# Patient Record
Sex: Female | Born: 1975
Health system: Southern US, Community
[De-identification: ages and names within clinical notes are randomized; demographics above are authoritative.]

---

## 2017-06-15 DIAGNOSIS — K259 Gastric ulcer, unspecified as acute or chronic, without hemorrhage or perforation: Secondary | ICD-10-CM

## 2017-06-15 HISTORY — DX: Gastric ulcer, unspecified as acute or chronic, without hemorrhage or perforation: K25.9

## 2017-07-26 ENCOUNTER — Encounter: Payer: Self-pay | Admitting: Physician Assistant

## 2017-07-26 ENCOUNTER — Other Ambulatory Visit: Payer: Self-pay

## 2017-07-26 ENCOUNTER — Ambulatory Visit (INDEPENDENT_AMBULATORY_CARE_PROVIDER_SITE_OTHER): Payer: BLUE CROSS/BLUE SHIELD

## 2017-07-26 ENCOUNTER — Ambulatory Visit: Payer: BLUE CROSS/BLUE SHIELD | Admitting: Physician Assistant

## 2017-07-26 VITALS — BP 142/78 | HR 100 | Temp 98.0°F | Resp 16 | Ht 64.0 in | Wt 135.0 lb

## 2017-07-26 DIAGNOSIS — K0510 Chronic gingivitis, plaque induced: Secondary | ICD-10-CM

## 2017-07-26 DIAGNOSIS — R042 Hemoptysis: Secondary | ICD-10-CM | POA: Diagnosis not present

## 2017-07-26 DIAGNOSIS — R0789 Other chest pain: Secondary | ICD-10-CM

## 2017-07-26 DIAGNOSIS — R079 Chest pain, unspecified: Secondary | ICD-10-CM | POA: Diagnosis not present

## 2017-07-26 LAB — CBC WITH DIFFERENTIAL/PLATELET
BASOS: 1 %
Basophils Absolute: 0 10*3/uL (ref 0.0–0.2)
EOS (ABSOLUTE): 0.1 10*3/uL (ref 0.0–0.4)
EOS: 2 %
HEMATOCRIT: 34.4 % (ref 34.0–46.6)
HEMOGLOBIN: 11.2 g/dL (ref 11.1–15.9)
Immature Grans (Abs): 0 10*3/uL (ref 0.0–0.1)
Immature Granulocytes: 0 %
Lymphocytes Absolute: 1.5 10*3/uL (ref 0.7–3.1)
Lymphs: 37 %
MCH: 30.8 pg (ref 26.6–33.0)
MCHC: 32.6 g/dL (ref 31.5–35.7)
MCV: 95 fL (ref 79–97)
MONOCYTES: 6 %
MONOS ABS: 0.2 10*3/uL (ref 0.1–0.9)
NEUTROS PCT: 54 %
Neutrophils Absolute: 2.1 10*3/uL (ref 1.4–7.0)
Platelets: 228 10*3/uL (ref 150–379)
RBC: 3.64 x10E6/uL — ABNORMAL LOW (ref 3.77–5.28)
RDW: 12.9 % (ref 12.3–15.4)
WBC: 3.9 10*3/uL (ref 3.4–10.8)

## 2017-07-26 MED ORDER — AMOXICILLIN-POT CLAVULANATE 875-125 MG PO TABS: 1.0000 | ORAL_TABLET | Freq: Two times a day (BID) | ORAL | 0 refills | Status: AC

## 2017-07-26 NOTE — Progress Notes (Signed)
[facility] [address], [place] [id number] [phone]  Date:  07/26/2017   Name:  [place] Agbodji Shaw Shaw   DOB:  02/28/1976   MRN:  [id number]  PCP:  Patient, No Pcp Per    History of Present Illness:  [place] Ag[place] Julie Shaw is a 42 y.o. female patient who presents to PCP with  Chief Complaint  Patient presents with  . blood in saliva    pt states when she wakes up in the morning when she first spit there blood mixed in.     She will spit out the saliva in the morning, where it will be bloody mixed with the spit.  This has occurred 6 months.   No night sweats.  No fatigue.  No abnormal coughing.   She has a hx of her left side of her teeth in the back with gum swelling.  This was an otc mouthwash.   No trhoat pain, trouble swallowing.  No heavy use etOH or use of nsaids. This has never happened before.   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  No feeling of anxiety that she can report.  It happens when she is sitting up, where she will have the chest pain and radiates to back.  No diaphoresis, sob, palpitations.    There are no active problems to display for this patient.   No past medical history on file.    Social History   Tobacco Use  . Smoking status: Never Smoker  . Smokeless tobacco: Never Used  Substance Use Topics  . Alcohol use: Not on file  . Drug use: Not on file    No family history on file.  Not on File  Medication list has been reviewed and updated.  No current outpatient medications on file prior to visit.   No current facility-administered medications on file prior to visit.     ROS ROS otherwise unremarkable unless listed above.  Physical Examination: BP (!) 142/78   Pulse 100   Temp 98 F (36.7 C) (Oral)   Resp 16   Ht 5\' 4"  (1.626 m)   Wt 135 lb (61.2 kg)   LMP 07/20/2017   SpO2 100%   BMI 23.17 kg/m  Ideal Body Weight: Weight in (lb) to have  BMI = 25: 145.3  Physical Exam  Constitutional: She is oriented to person, place, and time. She appears well-developed and well-nourished. No distress.  HENT:  Head: Normocephalic and atraumatic.  Right Ear: External ear normal.  Left Ear: External ear normal.  Mouth/Throat: Normal dentition. No oropharyngeal exudate or posterior oropharyngeal edema.  Left lower back molar with swelling and some sanguinal like fluid at the lateral area of the molar.   Eyes: Conjunctivae and EOM are normal. Pupils are equal, round, and reactive to light.  Cardiovascular: Normal rate, regular rhythm, normal heart sounds, intact distal pulses and normal pulses. Exam reveals no friction rub.  No murmur heard. Pulmonary/Chest: Effort normal. No respiratory distress. She exhibits no tenderness and no crepitus.  Neurological: She is alert and oriented to person, place, and time.  Skin: She is not diaphoretic.  Psychiatric: She has a normal mood and affect. Her behavior is normal.    Dg Chest 2 View  Result Date: 07/26/2017 CLINICAL DATA:  Central chest pain of sternum not associated with exertion. EXAM: CHEST  2 VIEW COMPARISON:  None. FINDINGS: Normal heart size and mediastinal contours. No acute infiltrate  or edema. No effusion or pneumothorax. No osseous findings. IMPRESSION: Negative chest. Electronically Signed   By: Marnee Spring M.D.   On: 07/26/2017 10:55   EKG: Sinus  Rhythm  WITHIN NORMAL LIMITS   Assessment and Plan: [name] Julie Simpson is a 42 y.o. female who is here today for cc of  Chief Complaint  Patient presents with  . blood in saliva    pt states when she wakes up in the morning when she first spit there blood mixed in.  appears to be gingival infection.  Treat with augmentin.  Advised to follow up with a dentist.  No sign of tb induced bloody sputum. Chest discomfort - Plan: EKG 12-Lead, DG Chest 2 View, CBC with Differential/Platelet  Bloody sputum - Plan: CBC with  Differential/Platelet, amoxicillin-clavulanate (AUGMENTIN) 875-125 MG tablet  Gum inflammation - Plan: amoxicillin-clavulanate (AUGMENTIN) 875-125 MG tablet  Trena Platt, PA-C [facility] 2/12/201910:24 AM

## 2017-07-26 NOTE — Patient Instructions (Addendum)
I will contact you with the test result, however everything looks fine.    IF you received an x-ray today, you will receive an invoice from [facility]. Please contact [facility] at [phone] with questions or concerns regarding your invoice.   IF you received labwork today, you will receive an invoice from [facility]. Please contact [facility] at [phone] with questions or concerns regarding your invoice.   Our billing staff will not be able to assist you with questions regarding bills from these companies.  You will be contacted with the lab results as soon as they are available. The fastest way to get your results is to activate your My Chart account. Instructions are located on the last page of this paperwork. If you have not heard from us regarding the results in 2 weeks, please contact this office.    '

## 2017-08-09 ENCOUNTER — Encounter: Payer: Self-pay | Admitting: Radiology

## 2017-08-18 ENCOUNTER — Telehealth: Payer: Self-pay | Admitting: General Practice

## 2017-08-18 NOTE — Telephone Encounter (Signed)
Patient called in for lab results after receiving letter, results given per note Trena PlattStephanie English, PA on 08/01/17, patient verbalized understanding. Unable to document in result note due to no result note found.

## 2017-09-15 ENCOUNTER — Encounter: Payer: Self-pay | Admitting: Physician Assistant

## 2017-12-08 ENCOUNTER — Other Ambulatory Visit: Payer: Self-pay

## 2017-12-08 ENCOUNTER — Encounter: Payer: Self-pay | Admitting: Physician Assistant

## 2017-12-08 ENCOUNTER — Ambulatory Visit: Payer: BLUE CROSS/BLUE SHIELD | Admitting: Physician Assistant

## 2017-12-08 VITALS — BP 126/66 | HR 87 | Temp 99.0°F | Resp 20 | Ht 63.39 in | Wt 135.0 lb

## 2017-12-08 DIAGNOSIS — G8929 Other chronic pain: Secondary | ICD-10-CM

## 2017-12-08 DIAGNOSIS — Z3189 Encounter for other procreative management: Secondary | ICD-10-CM

## 2017-12-08 DIAGNOSIS — R1013 Epigastric pain: Secondary | ICD-10-CM

## 2017-12-08 LAB — POCT URINALYSIS DIP (MANUAL ENTRY)
BILIRUBIN UA: NEGATIVE
BILIRUBIN UA: NEGATIVE mg/dL
Glucose, UA: NEGATIVE mg/dL
LEUKOCYTES UA: NEGATIVE
NITRITE UA: NEGATIVE
PROTEIN UA: NEGATIVE mg/dL
SPEC GRAV UA: 1.015 (ref 1.010–1.025)
Urobilinogen, UA: 0.2 E.U./dL
pH, UA: 5.5 (ref 5.0–8.0)

## 2017-12-08 LAB — POCT URINE PREGNANCY: Preg Test, Ur: NEGATIVE

## 2017-12-08 MED ORDER — RANITIDINE HCL 150 MG PO TABS: 150.0000 mg | ORAL_TABLET | Freq: Two times a day (BID) | ORAL | 0 refills | Status: DC

## 2017-12-08 NOTE — Patient Instructions (Addendum)
Your symptoms are suggestive of either GERD or ulcer.  I recommend avoiding irritating foods such as spices.  Below is a list of foods that can worsen your symptoms.  Avoid lying down immediately after consuming food.  I have given you prescription for medication to take twice daily.  Take 30 minutes before food.  We have collected labs today and should have those results back and 1 week.  I will contact you if you need to add any other medications to your regimen.  Please follow-up in 4 weeks for evaluation.  Return sooner if you have any worsening symptoms or new concerning findings.  I placed a referral to gynecology and they should contact you in 2 weeks to discuss fertility.  Food Choices for Gastroesophageal Reflux Disease, Adult When you have gastroesophageal reflux disease (GERD), the foods you eat and your eating habits are very important. Choosing the right foods can help ease your discomfort. What guidelines do I need to follow?  Choose fruits, vegetables, whole grains, and low-fat dairy products.  Choose low-fat meat, fish, and poultry.  Limit fats such as oils, salad dressings, butter, nuts, and avocado.  Keep a food diary. This helps you identify foods that cause symptoms.  Avoid foods that cause symptoms. These may be different for everyone.  Eat small meals often instead of 3 large meals a day.  Eat your meals slowly, in a place where you are relaxed.  Limit fried foods.  Cook foods using methods other than frying.  Avoid drinking alcohol.  Avoid drinking large amounts of liquids with your meals.  Avoid bending over or lying down until 2-3 hours after eating. What foods are not recommended? These are some foods and drinks that may make your symptoms worse: Vegetables Tomatoes. Tomato juice. Tomato and spaghetti sauce. Chili peppers. Onion and garlic. Horseradish. Fruits Oranges, grapefruit, and lemon (fruit and juice). Meats High-fat meats, fish, and poultry. This  includes hot dogs, ribs, ham, sausage, salami, and bacon. Dairy Whole milk and chocolate milk. Sour cream. Cream. Butter. Ice cream. Cream cheese. Drinks Coffee and tea. Bubbly (carbonated) drinks or energy drinks. Condiments Hot sauce. Barbecue sauce. Sweets/Desserts Chocolate and cocoa. Donuts. Peppermint and spearmint. Fats and Oils High-fat foods. This includes [place] fries and potato chips. Other Vinegar. Strong spices. This includes black pepper, white pepper, red pepper, cayenne, curry powder, cloves, ginger, and chili powder. The items listed above may not be a complete list of foods and drinks to avoid. Contact your dietitian for more information. This information is not intended to replace advice given to you by your health care provider. Make sure you discuss any questions you have with your health care provider. Document Released: 12/01/2011 Document Revised: 11/07/2015 Document Reviewed: 04/05/2013 Elsevier Interactive Patient Education  2017 Elsevier Inc.    Peptic Ulcer A peptic ulcer is a painful sore in the lining of your esophagus, stomach, or the first part of your small intestine. You may have pain in the area between your chest and your belly button. The most common causes of an ulcer are:  An infection.  Using certain pain medicines too often or too much.  Follow these instructions at home:  Avoid alcohol.  Avoid caffeine.  Do not use any tobacco products. These include cigarettes, chewing tobacco, and e-cigarettes. If you need help quitting, ask your doctor.  Take over-the-counter and prescription medicines only as told by your doctor. Do not stop or change your medicines unless you talk with your doctor about it  first.  Keep all follow-up visits as told by your doctor. This is important. Contact a doctor if:  You do not get better in 7 days after you start treatment.  You keep having an upset stomach (indigestion) or heartburn. Get help right away  if:  You have sudden, sharp pain in your belly (abdomen).  You have lasting belly pain.  You have bloody poop (stool) or black, tarry poop.  You throw up (vomit) blood. It may look like coffee grounds.  You feel light-headed or feel like you may pass out (faint).  You get weak.  You get sweaty or feel sticky and cold to the touch (clammy). This information is not intended to replace advice given to you by your health care provider. Make sure you discuss any questions you have with your health care provider. Document Released: 08/26/2009 Document Revised: 10/16/2015 Document Reviewed: 03/02/2015 Elsevier Interactive Patient Education  2018 [employer]  IF you received an x-ray today, you will receive an invoice from [facility]. Please contact [facility] at [phone] with questions or concerns regarding your invoice.   IF you received labwork today, you will receive an invoice from [facility]. Please contact [facility] at [phone] with questions or concerns regarding your invoice.   Our billing staff will not be able to assist you with questions regarding bills from these companies.  You will be contacted with the lab results as soon as they are available. The fastest way to get your results is to activate your My Chart account. Instructions are located on the last page of this paperwork. If you have not heard from us regarding the results in 2 weeks, please contact this office.

## 2017-12-08 NOTE — Progress Notes (Signed)
[address] Julie Shaw  MRN: [id number] DOB: Nov 05, 1975  Subjective:   Julie Shaw is a 42 y.o. female who presents for evaluation of intermittent abdominal pain.  Onset was> 1 year ago. Symptoms have been gradually worsening. Happens once a week. The pain is described as burning, and is 7/10 in intensity. Pain is located in the epigastric region without radiation.  Aggravating factors: hunger and spicy foods. Most of her foods have spices.  Alleviating factors: right after she eats. Associated symptoms: belching, flatus, nausea and vomiting. The patient denies dysphagia, anorexia, arthralagias, chills, constipation, diarrhea, dysuria, frequency, headache, hematochezia, hematuria, melena, myalgias and sweats. LMP 11/29/17. She is sexually active with monogamous partner. Has not tried anything for relief. Denies smoking, alcohol use, and frequent NSAID use.  No PSH of abdominal issues.   Patient also like to discuss referral to specialist.  She is wanting to conceive but has not had success.  Notes her menstrual cycles are typically every 21 days.  They are now every 20 days.  This is concerning for her as they have always been every 21 days.  They are 4 to 5 days in duration.  She is not on any contraceptives.   Review of Systems  Per HPI  There are no active problems to display for this patient.   No current outpatient medications on file prior to visit.   No current facility-administered medications on file prior to visit.     No Known Allergies    History reviewed. No pertinent past medical history. History reviewed. No pertinent surgical history. Social History   Socioeconomic History  . Marital status: Married    Spouse name: Not on file  . Number of children: 0  . Years of education: Not on file  . Highest education level: Not on file  Occupational History  . Not on file  Social Needs  . Financial resource strain: Not on file  . Food insecurity:   Worry: Not on file    Inability: Not on file  . Transportation needs:    Medical: Not on file    Non-medical: Not on file  Tobacco Use  . Smoking status: Never Smoker  . Smokeless tobacco: Never Used  Substance and Sexual Activity  . Alcohol use: Never    Frequency: Never  . Drug use: Never  . Sexual activity: Yes  Lifestyle  . Physical activity:    Days per week: Not on file    Minutes per session: Not on file  . Stress: Not on file  Relationships  . Social connections:    Talks on phone: Not on file    Gets together: Not on file    Attends religious service: Not on file    Active member of club or organization: Not on file    Attends meetings of clubs or organizations: Not on file    Relationship status: Not on file  . Intimate partner violence:    Fear of current or ex partner: Not on file    Emotionally abused: Not on file    Physically abused: Not on file    Forced sexual activity: Not on file  Other Topics Concern  . Not on file  Social History Narrative  . Not on file    Objective:  BP 126/66 (BP Location: Right Arm, Patient Position: Sitting, Cuff Size: Normal)   Pulse 87   Temp 99 F (37.2 C) (Oral)   Resp 20   Ht 5' 3.39" (1.61  m)   Wt 135 lb (61.2 kg)   LMP 11/29/2017 (Exact Date)   SpO2 100%   BMI 23.62 kg/m   Physical Exam  Constitutional: She is oriented to person, place, and time. She appears well-developed and well-nourished.  HENT:  Head: Normocephalic and atraumatic.  Eyes: Conjunctivae are normal.  Neck: Normal range of motion.  Pulmonary/Chest: Effort normal.  Abdominal: Soft. Normal appearance and bowel sounds are normal. She exhibits no distension. There is no tenderness.  Neurological: She is alert and oriented to person, place, and time.  Skin: Skin is warm and dry.  Psychiatric: She has a normal mood and affect.  Vitals reviewed.    Results for orders placed or performed in visit on 12/08/17 (from the past 24 hour(s))  POCT  urinalysis dipstick     Status: Abnormal   Collection Time: 12/08/17  9:34 AM  Result Value Ref Range   Color, UA yellow yellow   Clarity, UA clear clear   Glucose, UA negative negative mg/dL   Bilirubin, UA negative negative   Ketones, POC UA negative negative mg/dL   Spec Grav, UA 1.015 1.010 - 1.025   Blood, UA moderate (A) negative   pH, UA 5.5 5.0 - 8.0   Protein Ur, POC negative negative mg/dL   Urobilinogen, UA 0.2 0.2 or 1.0 E.U./dL   Nitrite, UA Negative Negative   Leukocytes, UA Negative Negative  POCT urine pregnancy     Status: None   Collection Time: 12/08/17  9:35 AM  Result Value Ref Range   Preg Test, Ur Negative Negative    Assessment and Plan :  1. Abdominal pain, chronic, epigastric DDX includes GERD versus PUD.  Labs pending.  Vitals stable.  No tenderness noted on exam.  Symptoms only occurring twice per week.  Recommend treatment with H2 blocker at this time.  H. pylori breath test obtained.  Patient encouraged to eliminate foods in her diet that provoke/aortic symptoms.  Given education material on food choices for GERD.  Plan to follow-up in 4 weeks for reevaluation.  If no full improvement at that time, consider PPI therapy.  Given strict ED precautions. - POCT urinalysis dipstick - POCT urine pregnancy - CMP14+EGFR - Lipase - H. pylori breath test - Urinalysis, microscopic only - ranitidine (ZANTAC) 150 MG tablet; Take 1 tablet (150 mg total) by mouth 2 (two) times daily.  Dispense: 60 tablet; Refill: 0 - CBC with Differential/Platelet  2. Encounter for fertility planning Reassured patient that having cycles 1 day sooner per month does not concerning at this time.  Continue to monitor cycles.  Will refer to OB/GYN for further discussion of conceiving at her age. - Ambulatory referral to Obstetrics / Gynecology  Side effects, risks, benefits, and alternatives of the medications and treatment plan prescribed today were discussed, and patient expressed  understanding of the instructions given. No barriers to understanding were identified. Red flags discussed in detail. Pt expressed understanding regarding what to do in case of emergency/urgent symptoms.  Tenna Delaine PA-C  Primary Care at [facility] 12/08/2017 10:36 AM

## 2017-12-09 LAB — CMP14+EGFR
A/G RATIO: 1.3 (ref 1.2–2.2)
ALBUMIN: 4.2 g/dL (ref 3.5–5.5)
ALT: 9 IU/L (ref 0–32)
AST: 19 IU/L (ref 0–40)
Alkaline Phosphatase: 61 IU/L (ref 39–117)
BUN / CREAT RATIO: 18 (ref 9–23)
BUN: 14 mg/dL (ref 6–24)
Bilirubin Total: 0.4 mg/dL (ref 0.0–1.2)
CALCIUM: 9.6 mg/dL (ref 8.7–10.2)
CO2: 23 mmol/L (ref 20–29)
Chloride: 104 mmol/L (ref 96–106)
Creatinine, Ser: 0.77 mg/dL (ref 0.57–1.00)
GFR, EST AFRICAN AMERICAN: 109 mL/min/{1.73_m2} (ref >59)
GFR, EST NON AFRICAN AMERICAN: 95 mL/min/{1.73_m2} (ref >59)
GLOBULIN, TOTAL: 3.3 g/dL (ref 1.5–4.5)
Glucose: 92 mg/dL (ref 65–99)
POTASSIUM: 3.9 mmol/L (ref 3.5–5.2)
SODIUM: 141 mmol/L (ref 134–144)
Total Protein: 7.5 g/dL (ref 6.0–8.5)

## 2017-12-09 LAB — CBC WITH DIFFERENTIAL/PLATELET
BASOS: 0 %
Basophils Absolute: 0 10*3/uL (ref 0.0–0.2)
EOS (ABSOLUTE): 0.1 10*3/uL (ref 0.0–0.4)
EOS: 2 %
HEMATOCRIT: 35.3 % (ref 34.0–46.6)
HEMOGLOBIN: 11.2 g/dL (ref 11.1–15.9)
IMMATURE GRANULOCYTES: 0 %
Immature Grans (Abs): 0 10*3/uL (ref 0.0–0.1)
LYMPHS ABS: 2.1 10*3/uL (ref 0.7–3.1)
Lymphs: 39 %
MCH: 30.4 pg (ref 26.6–33.0)
MCHC: 31.7 g/dL (ref 31.5–35.7)
MCV: 96 fL (ref 79–97)
MONOS ABS: 0.5 10*3/uL (ref 0.1–0.9)
Monocytes: 9 %
Neutrophils Absolute: 2.7 10*3/uL (ref 1.4–7.0)
Neutrophils: 50 %
Platelets: 193 10*3/uL (ref 150–450)
RBC: 3.69 x10E6/uL — ABNORMAL LOW (ref 3.77–5.28)
RDW: 11.8 % — AB (ref 12.3–15.4)
WBC: 5.3 10*3/uL (ref 3.4–10.8)

## 2017-12-09 LAB — URINALYSIS, MICROSCOPIC ONLY: CASTS: NONE SEEN /LPF

## 2017-12-09 LAB — H. PYLORI BREATH TEST: H pylori Breath Test: POSITIVE — AB

## 2017-12-09 LAB — H. PYLORI BREATH COLLECTION

## 2017-12-09 LAB — LIPASE: Lipase: 22 U/L (ref 14–72)

## 2017-12-14 NOTE — Progress Notes (Signed)
I called pt and it went to vm. I left message for pt to call office to schedule to f/u on labs

## 2017-12-23 ENCOUNTER — Ambulatory Visit: Payer: BLUE CROSS/BLUE SHIELD | Admitting: Physician Assistant

## 2017-12-23 ENCOUNTER — Other Ambulatory Visit: Payer: Self-pay

## 2017-12-23 ENCOUNTER — Encounter: Payer: Self-pay | Admitting: Physician Assistant

## 2017-12-23 VITALS — BP 108/60 | HR 85 | Temp 99.8°F | Resp 18 | Ht 63.39 in | Wt 132.4 lb

## 2017-12-23 DIAGNOSIS — B379 Candidiasis, unspecified: Secondary | ICD-10-CM

## 2017-12-23 DIAGNOSIS — A048 Other specified bacterial intestinal infections: Secondary | ICD-10-CM

## 2017-12-23 DIAGNOSIS — B9681 Helicobacter pylori [H. pylori] as the cause of diseases classified elsewhere: Secondary | ICD-10-CM

## 2017-12-23 DIAGNOSIS — T3695XA Adverse effect of unspecified systemic antibiotic, initial encounter: Secondary | ICD-10-CM | POA: Diagnosis not present

## 2017-12-23 MED ORDER — FLUCONAZOLE 150 MG PO TABS: 150.0000 mg | ORAL_TABLET | Freq: Once | ORAL | 0 refills | Status: AC

## 2017-12-23 MED ORDER — AMOXICILLIN 500 MG PO CAPS: 1000.0000 mg | ORAL_CAPSULE | Freq: Two times a day (BID) | ORAL | 0 refills | Status: AC

## 2017-12-23 MED ORDER — OMEPRAZOLE 20 MG PO CPDR: 20.0000 mg | DELAYED_RELEASE_CAPSULE | Freq: Two times a day (BID) | ORAL | 0 refills | Status: DC

## 2017-12-23 MED ORDER — CLARITHROMYCIN 500 MG PO TABS: 500.0000 mg | ORAL_TABLET | Freq: Two times a day (BID) | ORAL | 0 refills | Status: AC

## 2017-12-23 NOTE — Patient Instructions (Addendum)
Vous avez test positif pour l'infection h.pylori, qui est une infection qui peut causer des [employer]. Voici des [employer] cette infection. On vous a prescrit des mdicaments que vous devrez prendre quotidiennement pendant les deux prochaines semaines. Veuillez lire les tiquettes et prendre les doses recommandes pour les deux prochaines semaines. Mangez juste aprs avoir pris Lexicographer. Ces mdicaments peuvent causer des [employer], des nauses, des vomissements et de la diarrhe. Faites-nous savoir si vous ressentez des effets indsirables. Vous pouvez dvelopper une infection vaginale  levures avec l'utilisation  long terme d'antibiotiques. Si vous commencez  avoir des symptmes d'infection  levures (c.--d., dmangeaisons vaginales ou paisses pertes vaginales blanches), je vous ai donn un comprim appel diflucan que vous pouvez prendre. Merci de m'avoir permis de participer  votre sant et  votre bien-tre.     You have tested positive for h.pylori infection, which is an infection that can cause issues in the stomach. Below is info about this infection. You have been prescribed medication that you will need to take daily for the next two weeks. Please read the labels and take the recommended doses for the next two weeks. Eat right after you take the medication. These medications can cause upset stomach, nausea, vomiting, and diarrhea. Let us know if you experience any adverse effects. You can develop a vaginal yeast infection with long term use of antibiotics. If you start to have yeast infection symptoms (I.e., vaginal itching or thick white vaginal discharge), I have given you a tablet called diflucan you can take. Thank you for letting me participate in your health and well being.     Helicobacter Pylori Infection Helicobacter pylori infection is an infection in the stomach that is caused by the Helicobacter pylori (H. pylori) bacteria. This type of  bacteria often lives in the lining of the stomach. The infection can cause ulcers and irritation (gastritis) in some people. It is the most common cause of ulcers in the stomach (gastric ulcer) and in the upper part of the intestine (duodenal ulcer). Having this infection may also increase the risk of stomach cancer and a type of white blood cell cancer (lymphoma) that affects the stomach. What are the causes? H. pylori is a type of bacteria that is often found in the stomachs of healthy people. The bacteria may be passed from person to person through contact with stool or saliva. It is not known why some people develop ulcers, gastritis, or cancer from the infection. What increases the risk? This condition is more likely to develop in people who:  Have family members with the infection.  Live with many other people, such as in a dormitory.  Are of African, Hispanic, or Asian descent.  What are the signs or symptoms? Most people with this infection do not have symptoms. If you do have symptoms, they may include:  Heartburn.  Stomach pain.  Nausea.  Vomiting.  Blood-tinged vomit.  Loss of appetite.  Bad breath.  How is this diagnosed? This condition may be diagnosed based on your symptoms, a physical exam, and various tests. Tests may include:  Blood tests or stool tests to check for the proteins (antibodies) that your body may produce in response to the bacteria. These tests are the best way to confirm the diagnosis.  A breath test to check for the type of gas that the H. pylori bacteria release after breaking down a substance called urea. For the test, you are asked to drink urea. This test is  often done after treatment in order to find out if the treatment worked.  A procedure in which a thin, flexible tube with a tiny camera at the end is placed into your stomach and upper intestine (upper endoscopy). Your health care provider may also take tissue samples (biopsy) to test for  H. pylori and cancer.  How is this treated? Treatment for this condition usually involves taking a combination of medicines (triple therapy) for a couple of weeks. Triple therapy includes one medicine to reduce the acid in your stomach and two types of antibiotic medicines. Many drug combinations have been approved for treatment. Treatment usually kills the H. pylori and reduces your risk of cancer. You may need to be tested for H. pylori again after treatment. In some cases, the treatment may need to be repeated. Follow these instructions at home:  Take over-the-counter and prescription medicines only as told by your health care provider.  Take your antibiotics as told by your health care provider. Do not stop taking the antibiotics even if you start to feel better.  You can do all your usual activities and eat what you usually do.  Take steps to prevent future infections: ? Wash your hands often. ? Make sure the food you eat has been properly prepared. ? Drink water only from clean sources.  Keep all follow-up visits as told by your health care provider. This is important. Contact a health care provider if:  Your symptoms do not get better.  Your symptoms return after treatment. This information is not intended to replace advice given to you by your health care provider. Make sure you discuss any questions you have with your health care provider. Document Released: 09/23/2015 Document Revised: 11/07/2015 Document Reviewed: 06/13/2014 Elsevier Interactive Patient Education  2018 [employer]      IF you received an x-ray today, you will receive an invoice from [facility]. Please contact [facility] at [phone] with questions or concerns regarding your invoice.   IF you received labwork today, you will receive an invoice from [facility]. Please contact [facility] at [phone] with questions or concerns regarding your invoice.   Our billing staff will not be  able to assist you with questions regarding bills from these companies.  You will be contacted with the lab results as soon as they are available. The fastest way to get your results is to activate your My Chart account. Instructions are located on the last page of this paperwork. If you have not heard from us regarding the results in 2 weeks, please contact this office.

## 2017-12-23 NOTE — Progress Notes (Signed)
[address] Julie Shaw  MRN: [id number] DOB: 1975-11-17  Subjective:  Julie Shaw is a 42 y.o. female seen in office today for a chief complaint of follow-up on abdominal pain and to discuss lab results.  Patient was last seen on 12/08/2017 for intermittent epigastric pain for >1 year.  Labs were obtained and patient was started empirically on Zantac.  Notes that since her last visit the pain has definitely improved.  She is taking medication as prescribed.  Has no medication allergies that she is aware of.  Denies any other questions or concerns today.  Review of Systems  Constitutional: Negative for chills and fever.  Gastrointestinal: Negative for nausea and vomiting.    There are no active problems to display for this patient.   Current Outpatient Medications on File Prior to Visit  Medication Sig Dispense Refill  . ranitidine (ZANTAC) 150 MG tablet Take 1 tablet (150 mg total) by mouth 2 (two) times daily. 60 tablet 0   No current facility-administered medications on file prior to visit.     No Known Allergies    Social History   Socioeconomic History  . Marital status: Married    Spouse name: Not on file  . Number of children: 0  . Years of education: Not on file  . Highest education level: Not on file  Occupational History  . Not on file  Social Needs  . Financial resource strain: Not on file  . Food insecurity:    Worry: Not on file    Inability: Not on file  . Transportation needs:    Medical: Not on file    Non-medical: Not on file  Tobacco Use  . Smoking status: Never Smoker  . Smokeless tobacco: Never Used  Substance and Sexual Activity  . Alcohol use: Never    Frequency: Never  . Drug use: Never  . Sexual activity: Yes  Lifestyle  . Physical activity:    Days per week: Not on file    Minutes per session: Not on file  . Stress: Not on file  Relationships  . Social connections:    Talks on phone: Not on file    Gets together: Not  on file    Attends religious service: Not on file    Active member of club or organization: Not on file    Attends meetings of clubs or organizations: Not on file    Relationship status: Not on file  . Intimate partner violence:    Fear of current or ex partner: Not on file    Emotionally abused: Not on file    Physically abused: Not on file    Forced sexual activity: Not on file  Other Topics Concern  . Not on file  Social History Narrative  . Not on file    Objective:  BP 108/60   Pulse 85   Temp 99.8 F (37.7 C) (Oral)   Resp 18   Ht 5' 3.39" (1.61 m)   Wt 132 lb 6.4 oz (60.1 kg)   LMP 11/29/2017 (Exact Date)   SpO2 100%   BMI 23.17 kg/m   Physical Exam  Constitutional: She is oriented to person, place, and time. She appears well-developed and well-nourished. No distress.  HENT:  Head: Normocephalic and atraumatic.  Eyes: Conjunctivae are normal.  Neck: Normal range of motion.  Pulmonary/Chest: Effort normal.  Abdominal: Normal appearance. There is no tenderness.  Neurological: She is alert and oriented to person, place, and time.  Skin: Skin is warm and dry.  Psychiatric: She has a normal mood and affect.  Vitals reviewed.   Assessment and Plan :  .1. H. pylori infection Reassuring that abdominal pain has improved. Discussed lab results are positive for H. pylori.  Explained diagnosis of H. pylori and treatment regimen.  Given education material on H. pylori.  Advised to follow-up as needed. - omeprazole (PRILOSEC) 20 MG capsule; Take 1 capsule (20 mg total) by mouth 2 (two) times daily before a meal for 14 days.  Dispense: 28 capsule; Refill: 0 - clarithromycin (BIAXIN) 500 MG tablet; Take 1 tablet (500 mg total) by mouth 2 (two) times daily for 14 days.  Dispense: 28 tablet; Refill: 0 - amoxicillin (AMOXIL) 500 MG capsule; Take 2 capsules (1,000 mg total) by mouth 2 (two) times daily for 14 days.  Dispense: 56 capsule; Refill: 0  2. Antibiotic-induced yeast  infection - fluconazole (DIFLUCAN) 150 MG tablet; Take 1 tablet (150 mg total) by mouth once for 1 dose. Repeat if needed  Dispense: 2 tablet; Refill: 0  Side effects, risks, benefits, and alternatives of the medications and treatment plan prescribed today were discussed, and patient expressed understanding of the instructions given. No barriers to understanding were identified. Red flags discussed in detail. Pt expressed understanding regarding what to do in case of emergency/urgent symptoms.   Benjiman CoreBrittany Jissell Trafton PA-C  [facility]  [facility] 12/23/2017 11:35 AM

## 2017-12-25 ENCOUNTER — Encounter: Payer: Self-pay | Admitting: Physician Assistant

## 2019-04-19 ENCOUNTER — Ambulatory Visit: Payer: BLUE CROSS/BLUE SHIELD | Admitting: Family Medicine

## 2019-04-19 ENCOUNTER — Encounter: Payer: Self-pay | Admitting: Family Medicine

## 2019-04-19 ENCOUNTER — Other Ambulatory Visit: Payer: Self-pay

## 2019-04-19 VITALS — BP 139/78 | HR 125 | Temp 97.9°F | Wt 136.0 lb

## 2019-04-19 DIAGNOSIS — N939 Abnormal uterine and vaginal bleeding, unspecified: Secondary | ICD-10-CM | POA: Diagnosis not present

## 2019-04-19 DIAGNOSIS — Z3200 Encounter for pregnancy test, result unknown: Secondary | ICD-10-CM | POA: Diagnosis not present

## 2019-04-19 DIAGNOSIS — R140 Abdominal distension (gaseous): Secondary | ICD-10-CM

## 2019-04-19 DIAGNOSIS — R000 Tachycardia, unspecified: Secondary | ICD-10-CM

## 2019-04-19 LAB — POCT URINE PREGNANCY: Preg Test, Ur: NEGATIVE

## 2019-04-19 NOTE — Patient Instructions (Addendum)
Pregnancy test is normal today.  I will order an ultrasound of the abdomen and pelvis to look at other causes of bloating or enlargement.  I also checked some tests for the fast heart rate today.  Recheck in 1 week to discuss those results.  If you have any chest pain, or shortness of breath, be evaluated through the emergency room.   Return to the clinic or go to the nearest emergency room if any of your symptoms worsen or new symptoms occur.     If you have lab work done today you will be contacted with your lab results within the next 2 weeks.  If you have not heard from [place] then please contact us. The fastest way to get your results is to register for My Chart.   IF you received an x-ray today, you will receive an invoice from [facility]. Please contact [facility] at [phone] with questions or concerns regarding your invoice.   IF you received labwork today, you will receive an invoice from [place]. Please contact [facility] at [phone] with questions or concerns regarding your invoice.   Our billing staff will not be able to assist you with questions regarding bills from these companies.  You will be contacted with the lab results as soon as they are available. The fastest way to get your results is to activate your My Chart account. Instructions are located on the last page of this paperwork. If you have not heard from [place] regarding the results in 2 weeks, please contact this office.      Abdominal Bloating When you have abdominal bloating, your abdomen may feel full, tight, or painful. It may also look bigger than normal or swollen (distended). Common causes of abdominal bloating include:  Swallowing air.  Constipation.  Problems digesting food.  Eating too much.  Irritable bowel syndrome. This is a condition that affects the large intestine.  Lactose intolerance. This is an inability to digest lactose, a natural sugar in dairy products.  Celiac  disease. This is a condition that affects the ability to digest gluten, a protein found in some grains.  Gastroparesis. This is a condition that slows down the movement of food in the stomach and small intestine. It is more common in people with diabetes mellitus.  Gastroesophageal reflux disease (GERD). This is a digestive condition that makes stomach acid flow back into the esophagus.  Urinary retention. This means that the body is holding onto urine, and the bladder cannot be emptied all the way. Follow these instructions at home: Eating and drinking  Avoid eating too much.  Try not to swallow air while talking or eating.  Avoid eating while lying down.  Avoid these foods and drinks: ? Foods that cause gas, such as broccoli, cabbage, cauliflower, and baked beans. ? Carbonated drinks. ? Hard candy. ? Chewing gum. Medicines  Take over-the-counter and prescription medicines only as told by your health care provider.  Take probiotic medicines. These medicines contain live bacteria or yeasts that can help digestion.  Take coated peppermint oil capsules. Activity  Try to exercise regularly. Exercise may help to relieve bloating that is caused by gas and relieve constipation. General instructions  Keep all follow-up visits as told by your health care provider. This is important. Contact a health care provider if:  You have nausea and vomiting.  You have diarrhea.  You have abdominal pain.  You have unusual weight loss or weight gain.  You have severe pain, and medicines  do not help. Get help right away if:  You have severe chest pain.  You have trouble breathing.  You have shortness of breath.  You have trouble urinating.  You have darker urine than normal.  You have blood in your stools or have dark, tarry stools. Summary  Abdominal bloating means that the abdomen is swollen.  Common causes of abdominal bloating are swallowing air, constipation, and problems  digesting food.  Avoid eating too much and avoid swallowing air.  Avoid foods that cause gas, carbonated drinks, hard candy, and chewing gum. This information is not intended to replace advice given to you by your health care provider. Make sure you discuss any questions you have with your health care provider. Document Released: 07/03/2016 Document Revised: 09/19/2018 Document Reviewed: 07/03/2016 Elsevier Patient Education  2020 [employer].

## 2019-04-19 NOTE — Progress Notes (Signed)
Subjective:  Patient ID: Julie Shaw, female    DOB: [birth year]  Age: 43 y.o. MRN: [id number]  CC:  Chief Complaint  Patient presents with  . confirm pregnancy    Patient would like to confirm pregancy. Home test was negative.    HPI [place] presents for   Confirmation of pregnancy.   Stomach is getting bigger since end of September.  Home pregnancy test negative at end of September.  No n/v, no breast pain, no morning sickness.  Some bloating/cramping since end of September.  LMP 10/16 - 4 days. prior 9/21, 8/25. Closer intervals past few months.   No prior pregnancy.  Sexually active, trying to conceive. No contraception. No OBGYN.   No FH of uterine/ovarian CA.   No urinary symptoms, no constipation/diarrhea - BM QD.  No hematuria/BRBPR/melena.   Student at [employer], nursing.  No palpitations. Chest heaviness in past - last year not currently.  History There are no active problems to display for this patient.  No past medical history on file. No past surgical history on file. No Known Allergies Prior to Admission medications   Not on File   Social History   Socioeconomic History  . Marital status: Married    Spouse name: Not on file  . Number of children: 0  . Years of education: Not on file  . Highest education level: Not on file  Occupational History  . Not on file  Social Needs  . Financial resource strain: Not on file  . Food insecurity    Worry: Not on file    Inability: Not on file  . Transportation needs    Medical: Not on file    Non-medical: Not on file  Tobacco Use  . Smoking status: Never Smoker  . Smokeless tobacco: Never Used  Substance and Sexual Activity  . Alcohol use: Never    Frequency: Never  . Drug use: Never  . Sexual activity: Yes  Lifestyle  . Physical activity    Days per week: Not on file    Minutes per session: Not on file  . Stress: Not on file  Relationships  . Social Herbalist on phone: Not on file    Gets together: Not on file    Attends religious service: Not on file    Active member of club or organization: Not on file    Attends meetings of clubs or organizations: Not on file    Relationship status: Not on file  . Intimate partner violence    Fear of current or ex partner: Not on file    Emotionally abused: Not on file    Physically abused: Not on file    Forced sexual activity: Not on file  Other Topics Concern  . Not on file  Social History Narrative  . Not on file    Review of Systems   Objective:   Vitals:   04/19/19 1114 04/19/19 1115  BP: (!) 151/89 139/78  Pulse: (!) 132 (!) 125  Temp: 97.9 F (36.6 C)   TempSrc: Oral   SpO2: 100%   Weight: 136 lb (61.7 kg)      Physical Exam Constitutional:      General: She is not in acute distress.    Appearance: Normal appearance. She is well-developed. She is not ill-appearing, toxic-appearing or diaphoretic.  HENT:     Head: Normocephalic and atraumatic.  Cardiovascular:     Rate and Rhythm: Regular rhythm.  Tachycardia present.     Pulses: Normal pulses.     Comments: Tachycardic, but regular.  Pulmonary:     Effort: Pulmonary effort is normal.     Breath sounds: Normal breath sounds.  Abdominal:     General: There is distension (prominent, no fluid wave, nontender. ).     Palpations: There is no mass.     Tenderness: There is no abdominal tenderness. There is no right CVA tenderness, left CVA tenderness, guarding or rebound.  Skin:    General: Skin is warm and dry.  Neurological:     Mental Status: She is alert and oriented to person, place, and time.  Psychiatric:        Mood and Affect: Mood normal.        Behavior: Behavior normal.     Comments: States slight anxious in office.       Results for orders placed or performed in visit on 04/19/19  POCT urine pregnancy  Result Value Ref Range   Preg Test, Ur Negative Negative   Sinus tachycardia, rate 112.   Nonspecific ST in lead III possible LVH by QRS in lead aVL.  Change from February 2019.   Assessment & Plan:  Julie Shaw is a 43 y.o. female . Abdominal bloating - Plan: Comprehensive metabolic panel, Lipase, US Abdomen Complete, US Pelvis Complete Possible pregnancy, not yet confirmed - Plan: POCT urine pregnancy Abnormal uterine bleeding (AUB) - Plan: US Abdomen Complete, US Pelvis Complete  -Negative hCG, last menstrual period less than 1 month ago.  However has had narrowing of window between menses.  Denies intermenstrual bleeding.  Has had some cramping, with bloating/subjective distention.  -Check abdomen pelvic ultrasound to evaluate ovaries and other causes of distention.  -Check TSH, CMP, lipase  -Follow-up 1 week.   Tachycardia - Plan: CBC, TSH, EKG 12-Lead  -Asymptomatic, thought to be possible whitecoat component.  Some nonspecific changes on EKG compared to last year may be rate dependent.  Denies any recent chest pains, heaviness.  Plan for repeat EKG next week if heart rate lower, screen for thyroid disease, anemia as above.  ER precautions given if any shortness of breath, chest pain or new symptoms.  Understanding expressed.  No orders of the defined types were placed in this encounter.  Patient Instructions       If you have lab work done today you will be contacted with your lab results within the next 2 weeks.  If you have not heard from [place] then please contact us. The fastest way to get your results is to register for My Chart.   IF you received an x-ray today, you will receive an invoice from [facility]. Please contact [facility] at [phone] with questions or concerns regarding your invoice.   IF you received labwork today, you will receive an invoice from [place]. Please contact [facility] at [phone] with questions or concerns regarding your invoice.   Our billing staff will not be able to assist you with questions  regarding bills from these companies.  You will be contacted with the lab results as soon as they are available. The fastest way to get your results is to activate your My Chart account. Instructions are located on the last page of this paperwork. If you have not heard from [place] regarding the results in 2 weeks, please contact this office.          Signed, Meredith Staggers, MD [facility]

## 2019-04-20 LAB — CBC
Hematocrit: 35.6 % (ref 34.0–46.6)
Hemoglobin: 11.9 g/dL (ref 11.1–15.9)
MCH: 31.1 pg (ref 26.6–33.0)
MCHC: 33.4 g/dL (ref 31.5–35.7)
MCV: 93 fL (ref 79–97)
Platelets: 227 10*3/uL (ref 150–450)
RBC: 3.83 x10E6/uL (ref 3.77–5.28)
RDW: 11.8 % (ref 11.7–15.4)
WBC: 6.1 10*3/uL (ref 3.4–10.8)

## 2019-04-20 LAB — COMPREHENSIVE METABOLIC PANEL
ALT: 7 IU/L (ref 0–32)
AST: 19 IU/L (ref 0–40)
Albumin/Globulin Ratio: 1.4 (ref 1.2–2.2)
Albumin: 4.6 g/dL (ref 3.8–4.8)
Alkaline Phosphatase: 66 IU/L (ref 39–117)
BUN/Creatinine Ratio: 15 (ref 9–23)
BUN: 11 mg/dL (ref 6–24)
Bilirubin Total: 0.6 mg/dL (ref 0.0–1.2)
CO2: 21 mmol/L (ref 20–29)
Calcium: 10 mg/dL (ref 8.7–10.2)
Chloride: 101 mmol/L (ref 96–106)
Creatinine, Ser: 0.73 mg/dL (ref 0.57–1.00)
GFR calc Af Amer: 115 mL/min/{1.73_m2} (ref >59)
GFR calc non Af Amer: 100 mL/min/{1.73_m2} (ref >59)
Globulin, Total: 3.2 g/dL (ref 1.5–4.5)
Glucose: 100 mg/dL — ABNORMAL HIGH (ref 65–99)
Potassium: 4.1 mmol/L (ref 3.5–5.2)
Sodium: 138 mmol/L (ref 134–144)
Total Protein: 7.8 g/dL (ref 6.0–8.5)

## 2019-04-20 LAB — TSH: TSH: 2.31 u[IU]/mL (ref 0.450–4.500)

## 2019-04-20 LAB — LIPASE: Lipase: 20 U/L (ref 14–72)

## 2019-04-24 ENCOUNTER — Encounter: Payer: Self-pay | Admitting: Radiology

## 2019-04-26 ENCOUNTER — Ambulatory Visit
Admission: RE | Admit: 2019-04-26 | Discharge: 2019-04-26 | Disposition: A | Discharge status: Home / Self Care | Payer: BC Managed Care – PPO | Source: Ambulatory Visit | Attending: Family Medicine | Admitting: Family Medicine

## 2019-04-26 DIAGNOSIS — N939 Abnormal uterine and vaginal bleeding, unspecified: Secondary | ICD-10-CM | POA: Diagnosis not present

## 2019-04-26 DIAGNOSIS — R140 Abdominal distension (gaseous): Secondary | ICD-10-CM | POA: Diagnosis not present

## 2019-04-27 ENCOUNTER — Ambulatory Visit (INDEPENDENT_AMBULATORY_CARE_PROVIDER_SITE_OTHER): Payer: BC Managed Care – PPO | Admitting: Family Medicine

## 2019-04-27 ENCOUNTER — Encounter: Payer: Self-pay | Admitting: Family Medicine

## 2019-04-27 ENCOUNTER — Other Ambulatory Visit: Payer: Self-pay

## 2019-04-27 VITALS — BP 127/76 | HR 98 | Temp 99.4°F | Wt 137.6 lb

## 2019-04-27 DIAGNOSIS — Z3169 Encounter for other general counseling and advice on procreation: Secondary | ICD-10-CM | POA: Diagnosis not present

## 2019-04-27 DIAGNOSIS — R140 Abdominal distension (gaseous): Secondary | ICD-10-CM | POA: Diagnosis not present

## 2019-04-27 DIAGNOSIS — N939 Abnormal uterine and vaginal bleeding, unspecified: Secondary | ICD-10-CM

## 2019-04-27 MED ORDER — PRENATAL VITAMIN PLUS LOW IRON 27-1 MG PO TABS: 1.0000 | ORAL_TABLET | Freq: Every day | ORAL | 5 refills | Status: DC

## 2019-04-27 NOTE — Patient Instructions (Addendum)
   I will refer you to OB/GYN to discuss conception as well as previous changes in periods.  For now I recommend changing from the women's multivitamin to the prenatal vitamin once per day.  If abdominal bloating returns or other new symptoms, follow-up to discuss further.  Thank you for coming in today.    If you have lab work done today you will be contacted with your lab results within the next 2 weeks.  If you have not heard from [place] then please contact us. The fastest way to get your results is to register for My Chart.   IF you received an x-ray today, you will receive an invoice from [facility]. Please contact [facility] at [phone] with questions or concerns regarding your invoice.   IF you received labwork today, you will receive an invoice from [place]. Please contact [facility] at [phone] with questions or concerns regarding your invoice.   Our billing staff will not be able to assist you with questions regarding bills from these companies.  You will be contacted with the lab results as soon as they are available. The fastest way to get your results is to activate your My Chart account. Instructions are located on the last page of this paperwork. If you have not heard from [place] regarding the results in 2 weeks, please contact this office.

## 2019-04-27 NOTE — Progress Notes (Signed)
Subjective:  Patient ID: Julie Shaw, female    DOB: [birth year]  Age: 43 y.o. MRN: [id number]  CC:  Chief Complaint  Patient presents with  . Bloated    Was last sen here on 04/19/19 here for a 1 week f/u and to go over US of the abd    HPI [place] presents for   Abdominal bloating: Follow-up from November 4 evaluation.  Negative hCG at that time.  Abnormal bleeding with narrowing of the window between menses but denied intermenstrual bleeding.  Subjective bloating and distention discussed at that time.  Abdominal/pelvic ultrasound obtained, normal CBC, TSH, CMP, lipase. Abdominal and pelvic ultrasound obtained yesterday, negative abdomen ultrasound, normal pelvic ultrasound, transabdominal.  Bloating has resolved. Feels normal.  LMP 10/16. Has not had period yet. trying to conceive. G0PO On womens vitamin.    History There are no active problems to display for this patient.  No past medical history on file. No past surgical history on file. No Known Allergies Prior to Admission medications   Not on File   Social History   Socioeconomic History  . Marital status: Married    Spouse name: Not on file  . Number of children: 0  . Years of education: Not on file  . Highest education level: Not on file  Occupational History  . Not on file  Social Needs  . Financial resource strain: Not on file  . Food insecurity    Worry: Not on file    Inability: Not on file  . Transportation needs    Medical: Not on file    Non-medical: Not on file  Tobacco Use  . Smoking status: Never Smoker  . Smokeless tobacco: Never Used  Substance and Sexual Activity  . Alcohol use: Never    Frequency: Never  . Drug use: Never  . Sexual activity: Yes  Lifestyle  . Physical activity    Days per week: Not on file    Minutes per session: Not on file  . Stress: Not on file  Relationships  . Social Herbalist on phone: Not on file    Gets  together: Not on file    Attends religious service: Not on file    Active member of club or organization: Not on file    Attends meetings of clubs or organizations: Not on file    Relationship status: Not on file  . Intimate partner violence    Fear of current or ex partner: Not on file    Emotionally abused: Not on file    Physically abused: Not on file    Forced sexual activity: Not on file  Other Topics Concern  . Not on file  Social History Narrative  . Not on file    Review of Systems Per HPI.   Objective:   Vitals:   04/27/19 1047  BP: 127/76  Pulse: 98  Temp: 99.4 F (37.4 C)  TempSrc: Oral  SpO2: 100%  Weight: 137 lb 9.6 oz (62.4 kg)    Physical Exam Constitutional:      General: She is not in acute distress.    Appearance: She is well-developed.  HENT:     Head: Normocephalic and atraumatic.  Cardiovascular:     Rate and Rhythm: Normal rate.  Pulmonary:     Effort: Pulmonary effort is normal.  Abdominal:     General: There is no distension.     Palpations: Abdomen is soft. There  is no mass.     Tenderness: There is no abdominal tenderness. There is no guarding or rebound.  Neurological:     Mental Status: She is alert and oriented to person, place, and time.        Assessment & Plan:  Julie Shaw is a 43 y.o. female . Abnormal uterine bleeding (AUB) - Plan: Ambulatory referral to Obstetrics / Gynecology  Pre-conception counseling - Plan: Ambulatory referral to Obstetrics / Gynecology, Prenatal Vit-Fe Fumarate-FA (PRENATAL VITAMIN PLUS LOW IRON) 27-1 MG TABS  Abdominal bloating  Reassuring labs, abdominal bloating has resolved, ultrasound also reassuring.  Negative hCG last visit, and not late for menses at this time.  She is trying to conceive, will refer to OB/GYN to discuss previous abnormal uterine bleeding as well as pre-conception counseling.  She was started on prenatal vitamins with 1 mg folic acid for now.  RTC precautions  given.   No orders of the defined types were placed in this encounter.  Patient Instructions     I will refer you to OB/GYN to discuss conception as well as previous changes in periods.  For now I recommend changing from the women's multivitamin to the prenatal vitamin once per day.  If abdominal bloating returns or other new symptoms, follow-up to discuss further.  Thank you for coming in today.    If you have lab work done today you will be contacted with your lab results within the next 2 weeks.  If you have not heard from [place] then please contact us. The fastest way to get your results is to register for My Chart.   IF you received an x-ray today, you will receive an invoice from [facility]. Please contact [facility] at [phone] with questions or concerns regarding your invoice.   IF you received labwork today, you will receive an invoice from [place]. Please contact [facility] at [phone] with questions or concerns regarding your invoice.   Our billing staff will not be able to assist you with questions regarding bills from these companies.  You will be contacted with the lab results as soon as they are available. The fastest way to get your results is to activate your My Chart account. Instructions are located on the last page of this paperwork. If you have not heard from [place] regarding the results in 2 weeks, please contact this office.          Signed, Meredith Staggers, MD [facility]

## 2019-05-26 ENCOUNTER — Ambulatory Visit (INDEPENDENT_AMBULATORY_CARE_PROVIDER_SITE_OTHER): Payer: BC Managed Care – PPO | Admitting: Obstetrics and Gynecology

## 2019-05-26 ENCOUNTER — Other Ambulatory Visit: Payer: Self-pay

## 2019-05-26 ENCOUNTER — Encounter: Payer: Self-pay | Admitting: Obstetrics and Gynecology

## 2019-05-26 VITALS — BP 136/73 | HR 116 | Wt 137.7 lb

## 2019-05-26 DIAGNOSIS — Z3141 Encounter for fertility testing: Secondary | ICD-10-CM

## 2019-05-26 DIAGNOSIS — Z3169 Encounter for other general counseling and advice on procreation: Secondary | ICD-10-CM | POA: Diagnosis not present

## 2019-05-26 NOTE — Progress Notes (Signed)
Obstetrics and Gynecology New Patient Evaluation  Appointment Date: 05/26/2019  OBGYN Clinic: [facility]  Primary Care Provider: [facility]  Referring Provider: Shade Flood, MD  Chief Complaint: infertility counseling  History of Present Illness: Julie Shaw is a 43 y.o. African G0 (LMP: 12/9), seen for the above chief complaint. Her past medical history is significant for age  Patient referred by PCP re: fertility questions. Patient also having shorter periods, in overall length and duration (see below)  They have been trying for over a year with no success  Husband is 35 y/o, no children, no medical history, no tobacco use, not currently on medications.   She denies any hot flashes  Review of Systems: as noted in the History of Present Illness.   Past Medical History:  No past medical history on file.  Past Surgical History:  No past surgical history on file.  Past Obstetrical History:  OB History  Gravida Para Term Preterm AB Living  0 0 0 0 0 0  SAB TAB Ectopic Multiple Live Births  0 0 0 0 0    Past Gynecological History: As per HPI. Periods: qmonth (close to 21-24 days most recently), regular. Recently was only two days and History of STI(s): No. She is currently using no method for contraception and none recently   Social History:  Social History   Socioeconomic History  . Marital status: Married    Spouse name: Not on file  . Number of children: 0  . Years of education: Not on file  . Highest education level: Not on file  Occupational History  . Not on file  Tobacco Use  . Smoking status: Never Smoker  . Smokeless tobacco: Never Used  Substance and Sexual Activity  . Alcohol use: Never  . Drug use: Never  . Sexual activity: Yes  Other Topics Concern  . Not on file  Social History Narrative  . Not on file   Social Determinants of Health   Financial Resource Strain:   . Difficulty of  Paying Living Expenses: Not on file  Food Insecurity:   . Worried About Programme researcher, broadcasting/film/video in the Last Year: Not on file  . Ran Out of Food in the Last Year: Not on file  Transportation Needs:   . Lack of Transportation (Medical): Not on file  . Lack of Transportation (Non-Medical): Not on file  Physical Activity:   . Days of Exercise per Week: Not on file  . Minutes of Exercise per Session: Not on file  Stress:   . Feeling of Stress : Not on file  Social Connections:   . Frequency of Communication with Friends and Family: Not on file  . Frequency of Social Gatherings with Friends and Family: Not on file  . Attends Religious Services: Not on file  . Active Member of Clubs or Organizations: Not on file  . Attends Banker Meetings: Not on file  . Marital Status: Not on file  Intimate Partner Violence:   . Fear of Current or Ex-Partner: Not on file  . Emotionally Abused: Not on file  . Physically Abused: Not on file  . Sexually Abused: Not on file    Medications Julie Shaw had no medications administered during this visit. Current Outpatient Medications  Medication Sig Dispense Refill  . Prenatal Vit-Fe Fumarate-FA (PRENATAL VITAMIN PLUS LOW IRON) 27-1 MG TABS Take 1 tablet by mouth daily. (Patient not taking: Reported on 05/26/2019)  30 tablet 5   No current facility-administered medications for this visit.    Allergies Patient has no known allergies.   Physical Exam:  BP 136/73   Pulse (!) 116   Wt 137 lb 11.2 oz (62.5 kg)   BMI 24.09 kg/m  Body mass index is 24.09 kg/m. Neuro/Psych:  Normal mood and affect.  Skin:  Warm and dry.   Laboratory: none  Radiology: none  Assessment: pt stable  Plan:  1. Fertility testing I d/w her that her biggest issue with a successful spontaneous pregnancy, both in terms of getting pregnant and maintaining a pregnancy is her age.  This is especially true since her periods are getting shorter, which is  usually indicative of very decreased fertility/ovarian reserve. I told her that her age would be a big hurdle in getting pregnant and she has a high chance of miscarriage due to aneuploidy from [facility] and if the pregnancy continued that she is at very high risk of aneuploidy in that child, especially Down's Syndrome. I also told her that even though she is healthy that if she did get pregnant and it continued that she would be at risk for issues such as GDM, HTN/pre-eclampsia, preterm labor, preterm birth etc. I told her that there is nothing wrong with continuing to try as long as she is okay with those risks, but if she really wants to try and get pregnant that her best option is to see [facility], where I told her that they most likely will recommend her to use donor eggs, if she wants to carry the pregnancy, or using a surrogate.  I told her that an interim step that she and her husband should consider is for him to get a semen analysis at a fertility center such as the one here in [facility]) and to call them to see what the out of pocket price, typically $100-200. If would like a referral, pt to contact me and I told her I can set that up for her Patient thinks she may be pregnant due to bloating. Beta quant ordered - BHCG, Quant*LC  Orders Placed This Encounter  Procedures  . BHCG, Quant*LC    RTC PRN  Durene Romans MD Attending Center for [facility])

## 2019-05-26 NOTE — Progress Notes (Signed)
Multiple periods in a month  Started this august

## 2019-05-27 LAB — BETA HCG QUANT (REF LAB): hCG Quant: <1 m[IU]/mL

## 2020-01-18 ENCOUNTER — Encounter: Payer: Self-pay | Admitting: Family Medicine

## 2020-01-18 ENCOUNTER — Ambulatory Visit: Payer: BC Managed Care – PPO | Admitting: Family Medicine

## 2020-01-18 VITALS — BP 139/80 | HR 98 | Temp 98.5°F | Resp 15 | Ht 63.5 in | Wt 142.8 lb

## 2020-01-18 DIAGNOSIS — R140 Abdominal distension (gaseous): Secondary | ICD-10-CM

## 2020-01-18 LAB — POCT URINE PREGNANCY: Preg Test, Ur: NEGATIVE

## 2020-01-18 NOTE — Progress Notes (Signed)
Subjective:  Patient ID: Julie Shaw, female    DOB: 1976/02/19  Age: 44 y.o. MRN: [id number]  CC:  Chief Complaint  Patient presents with  . Possible Pregnancy    pt believes she may be pregnant, pt has been having breast tenderness, pt has not missed a period, was here in december for a test that was negative but has noticed her stomach getting bigger and would like another test     HPI [name] Epse Ak[place] presents for    History  Here for possible pregnancy Was seen in November 2020 with fertility concernss and abdominal bloating.   Abnormal uterine bleeding discussed at that time.  Abdominal bloating had improved.  Reassuring labs at that time.  Refer to OB/GYN to discuss things further but was placed on prenatal vitamins for preconception counseling.  Lipase, TSH, CMP, CBC and point-of-care urine pregnancy were all negative/normal in November. Glucose 100.  Negative abdominal and pelvic ultrasound April 26, 2019  Seen by GYN 05/26/19. Fertility discussed, option to see fertility specialist or semen analysis - not done. Beta hcg normal on 05/26/19.   Still feels like abdomen is getting bigger - progressive from last year.  No abdominal pain. No n/v. No gas/belching. No increased flatus.  Bowel movements normal, soft, daily. No bloating.  Urinating normally. LMP 7/23 - normal, regular menses.  No fever, night sweats, or weight changes known (5 pounds up per our scale).   Feels movement inside stomach at times, and concerned a baby may be in there. Understands interpretation of urine test.  Denies hx of anxiety/depression. Denies a/v/t hallucinations.    Wt Readings from Last 3 Encounters:  01/18/20 142 lb 12.8 oz (64.8 kg)  05/26/19 137 lb 11.2 oz (62.5 kg)  04/27/19 137 lb 9.6 oz (62.4 kg)   There are no problems to display for this patient.  No past medical history on file. No past surgical history on file. No Known Allergies Prior to Admission  medications   Medication Sig Start Date End Date Taking? Authorizing Provider  Prenatal Vit-Fe Fumarate-FA (PRENATAL VITAMIN PLUS LOW IRON) 27-1 MG TABS Take 1 tablet by mouth daily. Patient not taking: Reported on 05/26/2019 04/27/19   Shade Flood, MD   Social History   Socioeconomic History  . Marital status: Married    Spouse name: Not on file  . Number of children: 0  . Years of education: Not on file  . Highest education level: Not on file  Occupational History  . Not on file  Tobacco Use  . Smoking status: Never Smoker  . Smokeless tobacco: Never Used  Vaping Use  . Vaping Use: Never used  Substance and Sexual Activity  . Alcohol use: Never  . Drug use: Never  . Sexual activity: Yes  Other Topics Concern  . Not on file  Social History Narrative  . Not on file   Social Determinants of Health   Financial Resource Strain:   . Difficulty of Paying Living Expenses:   Food Insecurity:   . Worried About Programme researcher, broadcasting/film/video in the Last Year:   . Barista in the Last Year:   Transportation Needs:   . Freight forwarder (Medical):   Marland Kitchen Lack of Transportation (Non-Medical):   Physical Activity:   . Days of Exercise per Week:   . Minutes of Exercise per Session:   Stress:   . Feeling of Stress :   Social Connections:   .  Frequency of Communication with Friends and Family:   . Frequency of Social Gatherings with Friends and Family:   . Attends Religious Services:   . Active Member of Clubs or Organizations:   . Attends Banker Meetings:   Marland Kitchen Marital Status:   Intimate Partner Violence:   . Fear of Current or Ex-Partner:   . Emotionally Abused:   Marland Kitchen Physically Abused:   . Sexually Abused:     Review of Systems Per HPI.   Objective:   Vitals:   01/18/20 1559  BP: 139/80  Pulse: 98  Resp: 15  Temp: 98.5 F (36.9 C)  TempSrc: Temporal  SpO2: 100%  Weight: 142 lb 12.8 oz (64.8 kg)  Height: 5' 3.5" (1.613 m)     Physical  Exam Vitals reviewed.  Constitutional:      Appearance: She is well-developed.  HENT:     Head: Normocephalic and atraumatic.  Eyes:     Conjunctiva/sclera: Conjunctivae normal.     Pupils: Pupils are equal, round, and reactive to light.  Neck:     Vascular: No carotid bruit.  Cardiovascular:     Rate and Rhythm: Normal rate and regular rhythm.     Heart sounds: Normal heart sounds.  Pulmonary:     Effort: Pulmonary effort is normal.     Breath sounds: Normal breath sounds.  Abdominal:     Palpations: Abdomen is soft. There is no pulsatile mass.     Tenderness: There is no abdominal tenderness.     Comments: Prominent abdomen without apparent true distention, no focal tenderness, no mass appreciated. Negative Murphy's/McBurney's point testing. Normal bowel sounds.  Skin:    General: Skin is warm and dry.  Neurological:     Mental Status: She is alert and oriented to person, place, and time.  Psychiatric:        Attention and Perception: Attention normal.        Mood and Affect: Mood and affect normal.        Speech: Speech normal.        Behavior: Behavior normal.     Comments: Appropriate responses and does not appear to be responding to internal stimuli.      Results for orders placed or performed in visit on 01/18/20  POCT urine pregnancy  Result Value Ref Range   Preg Test, Ur Negative Negative     Assessment & Plan:  [name] Agbod[place] Epse Shona Simpson is a 44 y.o. female . Abdominal bloating - Plan: POCT urine pregnancy  -Reassuring exam, previously reassuring labs and ultrasound. Reports persistent enlargement of abdomen, and she is concerned that she may be pregnant even with negative hCG and normal menses. Will check beta hCG but unlikely. Recommended repeat ultrasound if worsening abdominal fullness/bloating if negative beta-hCG.   No orders of the defined types were placed in this encounter.  Patient Instructions       If you have lab work done today you will  be contacted with your lab results within the next 2 weeks.  If you have not heard from [place] then please contact us. The fastest way to get your results is to register for My Chart.   IF you received an x-ray today, you will receive an invoice from [facility]. Please contact [facility] at [phone] with questions or concerns regarding your invoice.   IF you received labwork today, you will receive an invoice from [place]. Please contact [facility] at [phone] with questions or concerns regarding your invoice.  Our billing staff will not be able to assist you with questions regarding bills from these companies.  You will be contacted with the lab results as soon as they are available. The fastest way to get your results is to activate your My Chart account. Instructions are located on the last page of this paperwork. If you have not heard from [place] regarding the results in 2 weeks, please contact this office.         Signed, Merri Ray, MD [facility]

## 2020-01-18 NOTE — Patient Instructions (Addendum)
I will check a blood test for pregnancy today, but if that is normal or negative I would recommend an ultrasound as the next step.    Please let me know if you have further questions.  If you have lab work done today you will be contacted with your lab results within the next 2 weeks.  If you have not heard from [place] then please contact us. The fastest way to get your results is to register for My Chart.   IF you received an x-ray today, you will receive an invoice from [facility]. Please contact [facility] at [phone] with questions or concerns regarding your invoice.   IF you received labwork today, you will receive an invoice from [place]. Please contact [facility] at [phone] with questions or concerns regarding your invoice.   Our billing staff will not be able to assist you with questions regarding bills from these companies.  You will be contacted with the lab results as soon as they are available. The fastest way to get your results is to activate your My Chart account. Instructions are located on the last page of this paperwork. If you have not heard from [place] regarding the results in 2 weeks, please contact this office.

## 2020-01-19 LAB — BETA HCG QUANT (REF LAB): hCG Quant: <1 m[IU]/mL

## 2020-10-23 ENCOUNTER — Ambulatory Visit (INDEPENDENT_AMBULATORY_CARE_PROVIDER_SITE_OTHER): Payer: BC Managed Care – PPO | Admitting: Family Medicine

## 2020-10-23 ENCOUNTER — Other Ambulatory Visit: Payer: Self-pay

## 2020-10-23 ENCOUNTER — Encounter: Payer: Self-pay | Admitting: Family Medicine

## 2020-10-23 VITALS — BP 130/70 | HR 100 | Temp 98.7°F | Ht 63.5 in | Wt 149.2 lb

## 2020-10-23 DIAGNOSIS — R198 Other specified symptoms and signs involving the digestive system and abdomen: Secondary | ICD-10-CM

## 2020-10-23 DIAGNOSIS — N939 Abnormal uterine and vaginal bleeding, unspecified: Secondary | ICD-10-CM | POA: Diagnosis not present

## 2020-10-23 LAB — POCT URINE PREGNANCY: Preg Test, Ur: NEGATIVE

## 2020-10-23 NOTE — Patient Instructions (Signed)
Let me know if you have any new symptoms.  We will be in touch with your lab results.

## 2020-10-23 NOTE — Progress Notes (Signed)
   Subjective:    Patient ID: Julie Shaw, female    DOB: [birth year], 45 y.o.   MRN: [id number]  HPI Chief Complaint  Patient presents with  . new pt get established     New pt get established. Stomach enlarged bloating since last year. No pain   She is new to the practice and here to establish care.  Originally from [place].   Previous medical care:  [facility]   Complains of an enlarging abdomen for the past 1 to 2 years.  States there is no explanation for this. She has been evaluated by her previous PCP for this issue and had testing done at that time.  She denies any other symptoms. States she is not eating enough calories for her abdomen to be enlarging due to weight gain.  States she has cut back on her diet and still cannot get her abdomen smaller.  Denies fever, chills, night sweats, dizziness, chest pain, palpitations, shortness of breath, abdominal pain, N/V/D, urinary symptoms, LE edema.    Reports a slight change to her menstrual cycles.  Periods are now every 40 days instead of every 36 days. No other abnormalities related to her cycles.   LMP: 10/10/2020  Reviewed allergies, medications, past medical, surgical, family, and social history.   Review of Systems Pertinent positives and negatives in the history of present illness.     Objective:   Physical Exam BP 130/70   Pulse 100   Temp 98.7 F (37.1 C)   Ht 5' 3.5" (1.613 m)   Wt 149 lb 3.2 oz (67.7 kg)   LMP 10/06/2020   BMI 26.02 kg/m   Alert and in no distress. Neck is supple without adenopathy or thyromegaly. Cardiac exam shows a regular sinus rhythm without murmurs or gallops. Lungs are clear to auscultation. Abdomen is soft, non tender, normal BS, no palpable masses, no rebound or guarding. Extremities without edema. Skin is warm and dry.        Assessment & Plan:  Abdomen enlarged - Plan: CBC with Differential/Platelet, Comprehensive metabolic panel, POCT urine pregnancy  Abnormal  uterine bleeding (AUB) - Plan: CBC with Differential/Platelet, Comprehensive metabolic panel, POCT urine pregnancy, TSH, T4, free, T3  Negative UPT  She is a pleasant 45 year old female who is new to the practice and here to establish care.  Her main concern is that her abdomen has been enlarging over the past year or so.  Review of the chart shows a negative chest x-ray, abdominal ultrasound and pelvic ultrasound for the same complaint. She has no other symptoms to report.  Discussed that sometimes one new symptom can help [place] figure out what is causing her symptom. I will check labs and follow-up.  Discussed our next step would potentially be referral to GI.

## 2020-10-24 LAB — CBC WITH DIFFERENTIAL/PLATELET
Basophils Absolute: 0 10*3/uL (ref 0.0–0.2)
Basos: 0 %
EOS (ABSOLUTE): 0 10*3/uL (ref 0.0–0.4)
Eos: 0 %
Hematocrit: 34.5 % (ref 34.0–46.6)
Hemoglobin: 11.9 g/dL (ref 11.1–15.9)
Immature Grans (Abs): 0 10*3/uL (ref 0.0–0.1)
Immature Granulocytes: 0 %
Lymphocytes Absolute: 2 10*3/uL (ref 0.7–3.1)
Lymphs: 33 %
MCH: 31.7 pg (ref 26.6–33.0)
MCHC: 34.5 g/dL (ref 31.5–35.7)
MCV: 92 fL (ref 79–97)
Monocytes Absolute: 0.3 10*3/uL (ref 0.1–0.9)
Monocytes: 6 %
Neutrophils Absolute: 3.6 10*3/uL (ref 1.4–7.0)
Neutrophils: 61 %
Platelets: 211 10*3/uL (ref 150–450)
RBC: 3.75 x10E6/uL — ABNORMAL LOW (ref 3.77–5.28)
RDW: 12 % (ref 11.7–15.4)
WBC: 6 10*3/uL (ref 3.4–10.8)

## 2020-10-24 LAB — T3: T3, Total: 149 ng/dL (ref 71–180)

## 2020-10-24 LAB — T4, FREE: Free T4: 1.06 ng/dL (ref 0.82–1.77)

## 2020-10-24 LAB — COMPREHENSIVE METABOLIC PANEL
ALT: 10 IU/L (ref 0–32)
AST: 16 IU/L (ref 0–40)
Albumin/Globulin Ratio: 1.4 (ref 1.2–2.2)
Albumin: 4.9 g/dL — ABNORMAL HIGH (ref 3.8–4.8)
Alkaline Phosphatase: 70 IU/L (ref 44–121)
BUN/Creatinine Ratio: 13 (ref 9–23)
BUN: 10 mg/dL (ref 6–24)
Bilirubin Total: 0.3 mg/dL (ref 0.0–1.2)
CO2: 23 mmol/L (ref 20–29)
Calcium: 10.2 mg/dL (ref 8.7–10.2)
Chloride: 101 mmol/L (ref 96–106)
Creatinine, Ser: 0.8 mg/dL (ref 0.57–1.00)
Globulin, Total: 3.5 g/dL (ref 1.5–4.5)
Glucose: 110 mg/dL — ABNORMAL HIGH (ref 65–99)
Potassium: 3.8 mmol/L (ref 3.5–5.2)
Sodium: 140 mmol/L (ref 134–144)
Total Protein: 8.4 g/dL (ref 6.0–8.5)
eGFR: 92 mL/min/{1.73_m2} (ref >59)

## 2020-10-24 LAB — TSH: TSH: 2.03 u[IU]/mL (ref 0.450–4.500)

## 2020-10-24 NOTE — Progress Notes (Signed)
Please check and see is she ate prior to her visit. If so, her blood sugar is fine, if she was fasting then add Hgb A1c if possible. Thanks.

## 2020-10-24 NOTE — Progress Notes (Signed)
Her labs are ok except for her blood sugar and we are waiting her Hgb A1c result. If she wants the referral to GI for her abdominal bloating then I am ok with the referral.

## 2020-10-25 ENCOUNTER — Other Ambulatory Visit: Payer: Self-pay | Admitting: Internal Medicine

## 2020-10-25 DIAGNOSIS — R140 Abdominal distension (gaseous): Secondary | ICD-10-CM

## 2020-10-25 DIAGNOSIS — R198 Other specified symptoms and signs involving the digestive system and abdomen: Secondary | ICD-10-CM

## 2020-11-06 ENCOUNTER — Encounter: Payer: Self-pay | Admitting: Gastroenterology

## 2020-11-18 LAB — SPECIMEN STATUS REPORT

## 2020-11-18 LAB — HGB A1C W/O EAG: Hgb A1c MFr Bld: 4.9 % (ref 4.8–5.6)

## 2020-12-17 ENCOUNTER — Ambulatory Visit (INDEPENDENT_AMBULATORY_CARE_PROVIDER_SITE_OTHER): Payer: BC Managed Care – PPO | Admitting: Gastroenterology

## 2020-12-17 ENCOUNTER — Encounter: Payer: Self-pay | Admitting: Gastroenterology

## 2020-12-17 ENCOUNTER — Other Ambulatory Visit: Payer: Self-pay

## 2020-12-17 VITALS — BP 144/60 | HR 127 | Ht 63.0 in | Wt 147.0 lb

## 2020-12-17 DIAGNOSIS — R140 Abdominal distension (gaseous): Secondary | ICD-10-CM

## 2020-12-17 DIAGNOSIS — Z1211 Encounter for screening for malignant neoplasm of colon: Secondary | ICD-10-CM

## 2020-12-17 MED ORDER — NA SULFATE-K SULFATE-MG SULF 17.5-3.13-1.6 GM/177ML PO SOLN: 1.0000 | Freq: Once | ORAL | 0 refills | Status: AC

## 2020-12-17 NOTE — Progress Notes (Signed)
HPI: 45 y/o fm is referred to gastroenterology for further evaluation of subjective bloating and abdominal distention.  Pt states that this has been going on for 2 years now and is getting progressively worse.  Her weight has not changed appreciatively per patient.  Previous evaluation for this complaint has included an abdominal US and pelvic US, both of which were unremarkable.  Despite the subjective increase in abdominal girth, the patient denies any other associated symptoms.  Specifically, the patient denies discomfort, early satiety, nausea/vomiting, excessive belching or flatulence or change in appetite.  She thinks that the swelling does vary somewhat over the course of the day but denies any sort of pattern (morning versus nighttime, postprandial).  She has normal bowel habits, with 1 formed stool daily in the mornings.  No blood in her stool. She reports of having significant abdominal pain and bloating in 2019.  She was found to have H. pylori and the symptoms resolved with antibiotic therapy (triple therapy). She does report recent changes in menstruation manifested as going up to 40 days between periods instead of her usual 28.  She denies any change in the amount or character of her bleeding.  She is actively trying to become pregnant and wonders if her abdominal swelling is related to pregnancy.  Her last pregnancy test was about a month ago. No fam hx of GI malignancy   Past Medical History:  Diagnosis Date   Gastric ulcer 2019    History reviewed. No pertinent surgical history.  No outpatient medications prior to visit.   No facility-administered medications prior to visit.    No Known Allergies  Family History  Problem Relation Age of Onset   Cancer - Colon Neg Hx    Esophageal cancer Neg Hx     Social History   Tobacco Use   Smoking status: Never   Smokeless tobacco: Never  Vaping Use   Vaping Use: Never used  Substance Use Topics   Alcohol use: Never   Drug  use: Never    ROS: As per history of present illness, otherwise negative  BP (!) 144/60   Pulse (!) 127   Ht 5\' 3"  (1.6 m)   Wt 147 lb (66.7 kg)   LMP 11/17/2020   SpO2 99%   BMI 26.04 kg/m  Constitutional: Well-developed and well-nourished. No distress. HEENT: Normocephalic and atraumatic. No scleral icterus. Cardiovascular: Normal rate, regular rhythm and intact distal pulses. No M/R/G Pulmonary/chest: Effort normal and breath sounds normal. No wheezing, rales or rhonchi. Abdominal: Soft, nontender.  Protuberant abdomen, but not tense or tympanic.  No fluid wave appreciated. Bowel sounds active throughout, no tinkling or high pitched bowel sounds. There are no masses palpable. No hepatosplenomegaly. Extremities: no clubbing, cyanosis, or edema Neurological: Alert and oriented to person place and time. Skin: Skin is warm and dry.  Psychiatric: Normal mood and affect. Behavior is normal.  RELEVANT LABS AND IMAGING: CBC    Component Value Date/Time   WBC 6.0 10/23/2020 1620   RBC 3.75 (L) 10/23/2020 1620   HGB 11.9 10/23/2020 1620   HCT 34.5 10/23/2020 1620   PLT 211 10/23/2020 1620   MCV 92 10/23/2020 1620   MCH 31.7 10/23/2020 1620   MCHC 34.5 10/23/2020 1620   RDW 12.0 10/23/2020 1620   LYMPHSABS 2.0 10/23/2020 1620   EOSABS 0.0 10/23/2020 1620   BASOSABS 0.0 10/23/2020 1620    CMP     Component Value Date/Time   NA 140 10/23/2020 1620   K  3.8 10/23/2020 1620   CL 101 10/23/2020 1620   CO2 23 10/23/2020 1620   GLUCOSE 110 (H) 10/23/2020 1620   BUN 10 10/23/2020 1620   CREATININE 0.80 10/23/2020 1620   CALCIUM 10.2 10/23/2020 1620   PROT 8.4 10/23/2020 1620   ALBUMIN 4.9 (H) 10/23/2020 1620   AST 16 10/23/2020 1620   ALT 10 10/23/2020 1620   ALKPHOS 70 10/23/2020 1620   BILITOT 0.3 10/23/2020 1620   GFRNONAA 100 04/19/2019 1625   GFRAA 115 04/19/2019 1625    CLINICAL DATA:  Abdominal bloating for a few months, abnormal uterine bleeding    EXAM: TRANSABDOMINAL ULTRASOUND OF PELVIS   TECHNIQUE: Transabdominal ultrasound examination of the pelvis was performed including evaluation of the uterus, ovaries, adnexal regions, and pelvic cul-de-sac. Transvaginal imaging was not ordered.   COMPARISON:  None   FINDINGS: Uterus   Measurements: 7.9 x 4.0 x 5.1 cm = volume: 86 mL. Anteverted. Normal morphology without mass   Endometrium   Thickness: 5 mm.  No endometrial fluid or focal abnormality   Right ovary   Measurements: 3.9 x 2.0 x 1.8 cm = volume: 7.5 mL. Normal morphology without mass   Left ovary   Measurements: 3.9 x 1.7 x 1.8 cm = volume: 6.0 mL. Normal morphology without mass   Other findings:  No free pelvic fluid.  No adnexal masses.   IMPRESSION: Normal exam.     Electronically Signed   By: Ulyses Southward M.D.   On: 04/26/2019 13:09  EXAM: ABDOMEN ULTRASOUND COMPLETE   COMPARISON:  None.   FINDINGS: Gallbladder: No gallstones or wall thickening visualized. No sonographic Murphy sign noted by sonographer.   Common bile duct: Diameter: Mm, within normal limits.   Liver: No focal lesion identified. Within normal limits in parenchymal echogenicity. Portal vein is patent on color Doppler imaging with normal direction of blood flow towards the liver.   IVC: No abnormality visualized.   Pancreas: Visualized portion unremarkable.   Spleen: Size and appearance within normal limits.   Right Kidney: Length: 8.8 cm. Echogenicity within normal limits. No mass or hydronephrosis visualized.   Left Kidney: Length: 9.5 cm. Echogenicity within normal limits. No mass or hydronephrosis visualized.   Abdominal aorta: No aneurysm visualized.   Other findings: None.   IMPRESSION: Negative abdomen ultrasound.     Electronically Signed   By: Danae Orleans M.D.   On: 04/26/2019 11:59   ASSESSMENT/PLAN: 45 year old female with 2 years of persistent subjective complaints of progressive abdominal  distention without other accompanying symptoms such as abdominal pain, nausea/vomiting, change in appetite with normal ultrasound.  Her exam is notable for a protuberant abdomen but otherwise is unremarkable (no tenderness, no tympany, no fluid).  Given pt's concern and appearance, will get plain film to evaluate for dilated loops of bowel.  Anticipate that this will be unremarkable given normal previous studies.  No ascites noted on previous US. We discussed the physiology of intestinal gas and its relationship to bloating and distention.  Pt has had multiple pregnancy tests, most recently a month ago.  - plain film abdomen  - Can consider testing for SIBO, however given the absence of other symptoms I am not sure how useful this would be.  Screening colonoscopy Patient is due for initial average risk screening colonoscopy. Will schedule patient for routine colonoscopy.  The risks (including bleeding, perforation, infection, missed lesions, medication reactions and possible hospitalization or surgery if complications occur), benefits, and alternatives to colonoscopy with possible biopsy  and possible polypectomy were discussed with the patient and she consents to proceed.      HG:DJMEQA, Vickie L, Np-c [address] [place],  [place] [address]

## 2020-12-17 NOTE — Patient Instructions (Signed)
If you are age 47 or older, your body mass index should be between 23-30. Your Body mass index is 26.04 kg/m. If this is out of the aforementioned range listed, please consider follow up with your Primary Care Provider.  If you are age 60 or younger, your body mass index should be between 19-25. Your Body mass index is 26.04 kg/m. If this is out of the aformentioned range listed, please consider follow up with your Primary Care Provider.   You have been scheduled for a colonoscopy. Please follow written instructions given to you at your visit today.  Please pick up your prep supplies at the pharmacy within the next 1-3 days. If you use inhalers (even only as needed), please bring them with you on the day of your procedure.   Your provider has requested that you have an abdominal x ray before leaving today. Please go to the basement floor to our Radiology department for the test.   Due to recent changes in healthcare laws, you may see the results of your imaging and laboratory studies on MyChart before your provider has had a chance to review them.  We understand that in some cases there may be results that are confusing or concerning to you. Not all laboratory results come back in the same time frame and the provider may be waiting for multiple results in order to interpret others.  Please give [place] 48 hours in order for your provider to thoroughly review all the results before contacting the office for clarification of your results.    The [facility] providers would like to encourage you to use [facility] to communicate with providers for non-urgent requests or questions.  Due to long hold times on the telephone, sending your provider a message by [facility] may be a faster and more efficient way to get a response.  Please allow 48 business hours for a response.  Please remember that this is for non-urgent requests.   It was a pleasure to see you today!  Thank you for trusting me with your  gastrointestinal care!    Scott E. Tomasa Rand , MD

## 2020-12-27 ENCOUNTER — Ambulatory Visit (INDEPENDENT_AMBULATORY_CARE_PROVIDER_SITE_OTHER)
Admission: RE | Admit: 2020-12-27 | Discharge: 2020-12-27 | Disposition: A | Discharge status: Home / Self Care | Payer: BC Managed Care – PPO | Source: Ambulatory Visit | Attending: Gastroenterology | Admitting: Gastroenterology

## 2020-12-27 ENCOUNTER — Other Ambulatory Visit: Payer: Self-pay

## 2020-12-27 DIAGNOSIS — Z1211 Encounter for screening for malignant neoplasm of colon: Secondary | ICD-10-CM | POA: Diagnosis not present

## 2020-12-27 DIAGNOSIS — R140 Abdominal distension (gaseous): Secondary | ICD-10-CM | POA: Diagnosis not present

## 2020-12-27 DIAGNOSIS — I878 Other specified disorders of veins: Secondary | ICD-10-CM | POA: Diagnosis not present

## 2020-12-31 NOTE — Progress Notes (Signed)
[name], your abdominal x-ray was unremarkable, with no dilated loops of bowel noted.  Given its chronicity, without progression and with multiple negative studies, I don't think your abdominal distention represents anything serious.   We can test for something call small intestinal bacterial overgrowth (SIBO) which causes chronic bloating/abdominal pain and typically diarrhea.  Your history does not fit the typical picture, but if you are very bothered by the bloating, that would be the next test I would order to find a cause for your symptoms.  This test is called a hydrogen breath test and the testing can be done from home if you like.

## 2021-02-13 HISTORY — PX: COLONOSCOPY: SHX174

## 2021-03-13 ENCOUNTER — Encounter: Payer: Self-pay | Admitting: Certified Registered Nurse Anesthetist

## 2021-03-14 ENCOUNTER — Other Ambulatory Visit: Payer: Self-pay

## 2021-03-14 ENCOUNTER — Encounter: Payer: Self-pay | Admitting: Gastroenterology

## 2021-03-14 ENCOUNTER — Ambulatory Visit (AMBULATORY_SURGERY_CENTER): Payer: BC Managed Care – PPO | Admitting: Gastroenterology

## 2021-03-14 VITALS — BP 114/54 | HR 101 | Temp 97.5°F | Resp 25 | Ht 63.0 in | Wt 147.0 lb

## 2021-03-14 DIAGNOSIS — Z1211 Encounter for screening for malignant neoplasm of colon: Secondary | ICD-10-CM | POA: Diagnosis not present

## 2021-03-14 DIAGNOSIS — R140 Abdominal distension (gaseous): Secondary | ICD-10-CM

## 2021-03-14 MED ORDER — SODIUM CHLORIDE 0.9 % IV SOLN: 500.0000 mL | Freq: Once | INTRAVENOUS | Status: DC

## 2021-03-14 NOTE — Progress Notes (Signed)
1045 HR > 100 with esmolol 25 mg given IV, MD updated, vss 

## 2021-03-14 NOTE — Progress Notes (Signed)
[facility] History and Physical   Primary Care Physician:  Avanell Shackleton, NP-C   Reason for Procedure:   Colon cancer screening  Plan:    Screening colonoscopy     HPI: Julie Shaw is a 45 y.o. female with chronic abdominal bloating and distention undergoing initial average risk screening colonoscopy. Her bloating has improved since her clinic visit in early July.  No family history of colon cancer.  She has regular bowel movements.   Past Medical History:  Diagnosis Date   Gastric ulcer 2019    History reviewed. No pertinent surgical history.  Prior to Admission medications   Not on File    No current outpatient medications on file.   Current Facility-Administered Medications  Medication Dose Route Frequency Provider Last Rate Last Admin   0.9 %  sodium chloride infusion  500 mL Intravenous Once Jenel Lucks, MD        Allergies as of 03/14/2021   (No Known Allergies)    Family History  Problem Relation Age of Onset   Cancer - Colon Neg Hx    Esophageal cancer Neg Hx    Colon cancer Neg Hx    Colon polyps Neg Hx    Stomach cancer Neg Hx    Rectal cancer Neg Hx     Social History   Socioeconomic History   Marital status: Married    Spouse name: Not on file   Number of children: 0   Years of education: Not on file   Highest education level: Not on file  Occupational History   Not on file  Tobacco Use   Smoking status: Never   Smokeless tobacco: Never  Vaping Use   Vaping Use: Never used  Substance and Sexual Activity   Alcohol use: Never   Drug use: Never   Sexual activity: Yes  Other Topics Concern   Not on file  Social History Narrative   Not on file   Social Determinants of Health   Financial Resource Strain: Not on file  Food Insecurity: Not on file  Transportation Needs: Not on file  Physical Activity: Not on file  Stress: Not on file  Social Connections: Not on file  Intimate Partner Violence:  Not on file    Review of Systems:  All other review of systems negative except as mentioned in the HPI.  Physical Exam: Vital signs BP (!) 168/83   Pulse (!) 120   Temp (!) 97.5 F (36.4 C)   Ht 5\' 3"  (1.6 m)   Wt 147 lb (66.7 kg)   LMP 03/13/2021   SpO2 100%   BMI 26.04 kg/m   General:   Alert,  Well-developed, well-nourished, pleasant and cooperative in NAD MP2 Lungs:  Clear throughout to auscultation.   Heart:  Tachycardic, regular rhythm; no murmurs, clicks, rubs,  or gallops. Abdomen:  Soft, nontender and nondistended. Normal bowel sounds.   Neuro/Psych:  Normal mood and affect. A and O x 3   Estanislao Harmon E. 03/15/2021, MD [facility]

## 2021-03-14 NOTE — Progress Notes (Signed)
1053 Ephedrine 10 mg given IV due to low BP, MD updated.   

## 2021-03-14 NOTE — Progress Notes (Signed)
CW - VS   

## 2021-03-14 NOTE — Op Note (Signed)
[facility] Patient Name: Julie Shaw Procedure Date: 03/14/2021 10:31 AM MRN: [id number] Endoscopist: Lorin Picket E. Tomasa Rand , MD Age: 45 Referring MD:  Date of Birth: 04-03-1976 Gender: Female Account #: [id number] Procedure:                Colonoscopy Indications:              Screening for colorectal malignant neoplasm, This                            is the patient's first colonoscopy Medicines:                Monitored Anesthesia Care Procedure:                Pre-Anesthesia Assessment:                           - Prior to the procedure, a History and Physical                            was performed, and patient medications and                            allergies were reviewed. The patient's tolerance of                            previous anesthesia was also reviewed. The risks                            and benefits of the procedure and the sedation                            options and risks were discussed with the patient.                            All questions were answered, and informed consent                            was obtained. Prior Anticoagulants: The patient has                            taken no previous anticoagulant or antiplatelet                            agents. ASA Grade Assessment: I - A normal, healthy                            patient. After reviewing the risks and benefits,                            the patient was deemed in satisfactory condition to                            undergo the procedure.  After obtaining informed consent, the colonoscope                            was passed under direct vision. Throughout the                            procedure, the patient's blood pressure, pulse, and                            oxygen saturations were monitored continuously. The                            Olympus CF-HQ190L [id number]) Colonoscope was                            introduced through the  anus and advanced to the the                            cecum, identified by appendiceal orifice and                            ileocecal valve. The colonoscopy was performed                            without difficulty. The patient tolerated the                            procedure well. The quality of the bowel                            preparation was good. The ileocecal valve,                            appendiceal orifice, and rectum were photographed.                            The bowel preparation used was SUPREP via split                            dose instruction. Scope In: 10:50:37 AM Scope Out: 11:05:34 AM Scope Withdrawal Time: 0 hours 9 minutes 56 seconds  Total Procedure Duration: 0 hours 14 minutes 57 seconds  Findings:                 The perianal and digital rectal examinations were                            normal. Pertinent negatives include normal                            sphincter tone and no palpable rectal lesions.                           The colon (entire examined portion) appeared normal.  Non-bleeding internal hemorrhoids were found during                            retroflexion. The hemorrhoids were Grade I                            (internal hemorrhoids that do not prolapse).                           No additional abnormalities were found on                            retroflexion. Complications:            No immediate complications. Estimated Blood Loss:     Estimated blood loss: none. Impression:               - The entire examined colon is normal.                           - Non-bleeding internal hemorrhoids.                           - No specimens collected. Recommendation:           - Patient has a contact number available for                            emergencies. The signs and symptoms of potential                            delayed complications were discussed with the                            patient. Return  to normal activities tomorrow.                            Written discharge instructions were provided to the                            patient.                           - Resume previous diet.                           - Continue present medications.                           - Repeat colonoscopy in 10 years for screening                            purposes. Julie Shaw E. Tomasa Rand, MD 03/14/2021 11:09:58 AM This report has been signed electronically.

## 2021-03-14 NOTE — Progress Notes (Signed)
Report given to PACU, vss 

## 2021-03-14 NOTE — Patient Instructions (Signed)
Resume previous diet Continue current medications Repeat colonoscopy in 10 years!!!!!!  YOU HAD AN ENDOSCOPIC PROCEDURE TODAY AT THE [facility]:   Refer to the procedure report that was given to you for any specific questions about what was found during the examination.  If the procedure report does not answer your questions, please call your gastroenterologist to clarify.  If you requested that your care partner not be given the details of your procedure findings, then the procedure report has been included in a sealed envelope for you to review at your convenience later.  YOU SHOULD EXPECT: Some feelings of bloating in the abdomen. Passage of more gas than usual.  Walking can help get rid of the air that was put into your GI tract during the procedure and reduce the bloating. If you had a lower endoscopy (such as a colonoscopy or flexible sigmoidoscopy) you may notice spotting of blood in your stool or on the toilet paper. If you underwent a bowel prep for your procedure, you may not have a normal bowel movement for a few days.  Please Note:  You might notice some irritation and congestion in your nose or some drainage.  This is from the oxygen used during your procedure.  There is no need for concern and it should clear up in a day or so.  SYMPTOMS TO REPORT IMMEDIATELY:  Following lower endoscopy (colonoscopy or flexible sigmoidoscopy):  Excessive amounts of blood in the stool  Significant tenderness or worsening of abdominal pains  Swelling of the abdomen that is new, acute  Fever of 100F or higher  For urgent or emergent issues, a gastroenterologist can be reached at any hour by calling [phone]. Do not use MyChart messaging for urgent concerns.   DIET:  We do recommend a small meal at first, but then you may proceed to your regular diet.  Drink plenty of fluids but you should avoid alcoholic beverages for 24 hours.  ACTIVITY:  You should plan to take it easy for the  rest of today and you should NOT DRIVE or use heavy machinery until tomorrow (because of the sedation medicines used during the test).    FOLLOW UP: Our staff will call the number listed on your records 48-72 hours following your procedure to check on you and address any questions or concerns that you may have regarding the information given to you following your procedure. If we do not reach you, we will leave a message.  We will attempt to reach you two times.  During this call, we will ask if you have developed any symptoms of COVID 19. If you develop any symptoms (ie: fever, flu-like symptoms, shortness of breath, cough etc.) before then, please call [phone].  If you test positive for Covid 19 in the 2 weeks post procedure, please call and report this information to us.    If any biopsies were taken you will be contacted by phone or by letter within the next 1-3 weeks.  Please call us at [phone] if you have not heard about the biopsies in 3 weeks.   SIGNATURES/CONFIDENTIALITY: You and/or your care partner have signed paperwork which will be entered into your electronic medical record.  These signatures attest to the fact that that the information above on your After Visit Summary has been reviewed and is understood.  Full responsibility of the confidentiality of this discharge information lies with you and/or your care-partner.  

## 2021-03-18 ENCOUNTER — Telehealth: Payer: Self-pay | Admitting: *Deleted

## 2021-03-18 NOTE — Telephone Encounter (Signed)
  Follow up Call-  Call back number 03/14/2021  Post procedure Call Back phone  # [phone]  Permission to leave phone message Yes  Some recent data might be hidden     Patient questions:   Phone was answered but hung up.

## 2021-03-18 NOTE — Telephone Encounter (Signed)
Attempted 2nd phone call. No answer. Rings once and then busy signal. Verified number is correct per pt contact information.

## 2021-08-14 ENCOUNTER — Other Ambulatory Visit: Payer: Self-pay

## 2021-08-14 ENCOUNTER — Ambulatory Visit (INDEPENDENT_AMBULATORY_CARE_PROVIDER_SITE_OTHER): Payer: BC Managed Care – PPO | Admitting: Physician Assistant

## 2021-08-14 ENCOUNTER — Encounter: Payer: Self-pay | Admitting: Physician Assistant

## 2021-08-14 VITALS — BP 140/70 | HR 100 | Temp 99.5°F | Ht 63.0 in | Wt 149.6 lb

## 2021-08-14 DIAGNOSIS — R198 Other specified symptoms and signs involving the digestive system and abdomen: Secondary | ICD-10-CM | POA: Diagnosis not present

## 2021-08-14 DIAGNOSIS — R140 Abdominal distension (gaseous): Secondary | ICD-10-CM

## 2021-08-14 DIAGNOSIS — Z8711 Personal history of peptic ulcer disease: Secondary | ICD-10-CM | POA: Diagnosis not present

## 2021-08-14 NOTE — Patient Instructions (Addendum)
To improve digestion of food and/or ease stomach discomfort, you can take any OTC probiotic and any OTC digestive enzyme. You can go to a store with a pharmacy and the staff can show you where these items are on the shelf. You can also take anti-gas pills for the bloating, called simethicone. ?

## 2021-08-14 NOTE — Progress Notes (Signed)
? ?Acute Office Visit ? ?Subjective:  ? ? Patient ID: Julie Shaw, female    DOB: Nov 01, 1975, 46 y.o.   MRN: [id number] ? ?Chief Complaint  ?Patient presents with  ? enlarged abdomen  ?  Patient states she had pregnancy test in May in and it was negative. She was seen by Gertie Fey and states all testing was negative. She said her abdomen is getting bigger and bigger and last night she felt a strange sensation like something was pressing on her belly from the inside. Was asked to give a urine and by mistake urinated in the toilet.   ? ? ?HPI ?Patient is in today for a follow up appointment. Reports a lot of abdominal pressure that happened yesterday for 15 minutes and then it stopped; no pain, just pressure; moves bowels daily, denies diarrhea and constipation; denies blood in stool; denies fever/chills/nausea/vomiting; LMP 2nd week of February; is sexually active with Husband; isn't using birth control and states she wants to get pregnant; doesn't have any children. States her diet has been the same for her and doesn't feel this is typical weight gain; states she doesn't exercise; drinks 1 liter of water a day; denies being gassy or having heartburn; denies previous abdominal surgery, has appendix and gall bladder; at the beginning of the office visit she went to the bathroom to give a urine specimen and accidentally urinated it the toilet and forgot to urinate in the specimen cup. Patient states she doesn't feel she is bloated and just wants to make sure she isn't pregnant. ? ?Last Colonoscopy 03/14/2021 -  - by Dr. Nicki Reaper E. Cunningham, Repeat colonoscopy in 10 years for screening purposes. ? ?Past Medical History:  ?Diagnosis Date  ? Gastric ulcer 2019  ? ? ?No past surgical history on file. ? ?Family History  ?Problem Relation Age of Onset  ? Cancer - Colon Neg Hx   ? Esophageal cancer Neg Hx   ? Colon cancer Neg Hx   ? Colon polyps Neg Hx   ? Stomach cancer Neg Hx   ? Rectal cancer Neg Hx   ? ? ?Social  History  ? ?Socioeconomic History  ? Marital status: Married  ?  Spouse name: Not on file  ? Number of children: 0  ? Years of education: Not on file  ? Highest education level: Not on file  ?Occupational History  ? Not on file  ?Tobacco Use  ? Smoking status: Never  ? Smokeless tobacco: Never  ?Vaping Use  ? Vaping Use: Never used  ?Substance and Sexual Activity  ? Alcohol use: Never  ? Drug use: Never  ? Sexual activity: Yes  ?Other Topics Concern  ? Not on file  ?Social History Narrative  ? Not on file  ? ?Social Determinants of Health  ? ?Financial Resource Strain: Not on file  ?Food Insecurity: Not on file  ?Transportation Needs: Not on file  ?Physical Activity: Not on file  ?Stress: Not on file  ?Social Connections: Not on file  ?Intimate Partner Violence: Not on file  ? ? ?No outpatient medications prior to visit.  ? ?No facility-administered medications prior to visit.  ? ? ?No Known Allergies ? ?Review of Systems  ?Constitutional:  Negative for activity change and chills.  ?HENT:  Negative for congestion and voice change.   ?Eyes:  Negative for pain and redness.  ?Respiratory:  Negative for cough and wheezing.   ?Cardiovascular:  Negative for chest pain.  ?Gastrointestinal:  Positive for abdominal  distention. Negative for abdominal pain, blood in stool, constipation, diarrhea, nausea and vomiting.  ?Endocrine: Negative for polyuria.  ?Genitourinary:  Negative for frequency.  ?Skin:  Negative for color change and rash.  ?Allergic/Immunologic: Negative for immunocompromised state.  ?Neurological:  Negative for dizziness.  ?Psychiatric/Behavioral:  Negative for agitation.   ? ?   ?Objective:  ?  ?Physical Exam ?Vitals and nursing note reviewed.  ?Constitutional:   ?   General: She is not in acute distress. ?   Appearance: Normal appearance. She is not ill-appearing.  ?HENT:  ?   Head: Normocephalic and atraumatic.  ?   Right Ear: External ear normal.  ?   Left Ear: External ear normal.  ?   Nose: No  congestion.  ?Eyes:  ?   Extraocular Movements: Extraocular movements intact.  ?   Conjunctiva/sclera: Conjunctivae normal.  ?   Pupils: Pupils are equal, round, and reactive to light.  ?Cardiovascular:  ?   Rate and Rhythm: Normal rate and regular rhythm.  ?   Pulses: Normal pulses.  ?   Heart sounds: Normal heart sounds.  ?Pulmonary:  ?   Effort: Pulmonary effort is normal.  ?   Breath sounds: Normal breath sounds. No wheezing.  ?Abdominal:  ?   General: Abdomen is protuberant. Bowel sounds are normal. There is distension.  ?   Palpations: Abdomen is soft.  ?   Tenderness: There is no abdominal tenderness.  ?Musculoskeletal:     ?   General: Normal range of motion.  ?   Cervical back: Normal range of motion and neck supple.  ?   Right lower leg: No edema.  ?   Left lower leg: No edema.  ?Skin: ?   General: Skin is warm and dry.  ?   Findings: No bruising.  ?Neurological:  ?   General: No focal deficit present.  ?   Mental Status: She is alert and oriented to person, place, and time.  ?Psychiatric:     ?   Mood and Affect: Mood normal.     ?   Behavior: Behavior normal.     ?   Thought Content: Thought content normal.  ? ? ?BP 140/70   Pulse 100   Temp 99.5 ?F (37.5 ?C) (Tympanic)   Ht 5' 3"  (1.6 m)   Wt 149 lb 9.6 oz (67.9 kg)   LMP 07/21/2021 (Approximate)   SpO2 99%   BMI 26.50 kg/m?  ? ?Wt Readings from Last 3 Encounters:  ?08/14/21 149 lb 9.6 oz (67.9 kg)  ?03/14/21 147 lb (66.7 kg)  ?12/17/20 147 lb (66.7 kg)  ? ? ?Health Maintenance Due  ?Topic Date Due  ? HIV Screening  Never done  ? Hepatitis C Screening  Never done  ? TETANUS/TDAP  Never done  ? PAP SMEAR-Modifier  Never done  ? INFLUENZA VACCINE  Never done  ? ? ?There are no preventive care reminders to display for this patient. ? ? ?Lab Results  ?Component Value Date  ? TSH 2.030 10/23/2020  ? ?Lab Results  ?Component Value Date  ? WBC 6.0 10/23/2020  ? HGB 11.9 10/23/2020  ? HCT 34.5 10/23/2020  ? MCV 92 10/23/2020  ? PLT 211 10/23/2020  ? ?Lab  Results  ?Component Value Date  ? NA 140 10/23/2020  ? K 3.8 10/23/2020  ? CO2 23 10/23/2020  ? GLUCOSE 110 (H) 10/23/2020  ? BUN 10 10/23/2020  ? CREATININE 0.80 10/23/2020  ? BILITOT 0.3 10/23/2020  ? ALKPHOS  70 10/23/2020  ? AST 16 10/23/2020  ? ALT 10 10/23/2020  ? PROT 8.4 10/23/2020  ? ALBUMIN 4.9 (H) 10/23/2020  ? CALCIUM 10.2 10/23/2020  ? EGFR 92 10/23/2020  ? ?No results found for: CHOL ?No results found for: HDL ?No results found for: [place] ?No results found for: TRIG ?No results found for: CHOLHDL ?Lab Results  ?Component Value Date  ? HGBA1C 4.9 10/23/2020  ? ? ?   ?Assessment & Plan:  ? ?Problem List Items Addressed This Visit   ?None ? ? ? ?No orders of the defined types were placed in this encounter. ? ?If all labs are normal, will consider a referral to OB-Gyn for hormone level evaluation. ? ?Irene Pap, PA-C ? ?

## 2021-08-15 LAB — CBC WITH DIFFERENTIAL/PLATELET
Basophils Absolute: 0 10*3/uL (ref 0.0–0.2)
Basos: 0 %
EOS (ABSOLUTE): 0.2 10*3/uL (ref 0.0–0.4)
Eos: 3 %
Hematocrit: 36.5 % (ref 34.0–46.6)
Hemoglobin: 11.9 g/dL (ref 11.1–15.9)
Immature Grans (Abs): 0 10*3/uL (ref 0.0–0.1)
Immature Granulocytes: 0 %
Lymphocytes Absolute: 1.5 10*3/uL (ref 0.7–3.1)
Lymphs: 28 %
MCH: 30.4 pg (ref 26.6–33.0)
MCHC: 32.6 g/dL (ref 31.5–35.7)
MCV: 93 fL (ref 79–97)
Monocytes Absolute: 0.4 10*3/uL (ref 0.1–0.9)
Monocytes: 7 %
Neutrophils Absolute: 3.2 10*3/uL (ref 1.4–7.0)
Neutrophils: 62 %
Platelets: 222 10*3/uL (ref 150–450)
RBC: 3.91 x10E6/uL (ref 3.77–5.28)
RDW: 12.5 % (ref 11.7–15.4)
WBC: 5.3 10*3/uL (ref 3.4–10.8)

## 2021-08-15 LAB — COMPREHENSIVE METABOLIC PANEL
ALT: 8 IU/L (ref 0–32)
AST: 16 IU/L (ref 0–40)
Albumin/Globulin Ratio: 1.5 (ref 1.2–2.2)
Albumin: 4.9 g/dL — ABNORMAL HIGH (ref 3.8–4.8)
Alkaline Phosphatase: 63 IU/L (ref 44–121)
BUN/Creatinine Ratio: 18 (ref 9–23)
BUN: 12 mg/dL (ref 6–24)
Bilirubin Total: 0.5 mg/dL (ref 0.0–1.2)
CO2: 21 mmol/L (ref 20–29)
Calcium: 9.8 mg/dL (ref 8.7–10.2)
Chloride: 102 mmol/L (ref 96–106)
Creatinine, Ser: 0.66 mg/dL (ref 0.57–1.00)
Globulin, Total: 3.3 g/dL (ref 1.5–4.5)
Glucose: 95 mg/dL (ref 70–99)
Potassium: 4 mmol/L (ref 3.5–5.2)
Sodium: 138 mmol/L (ref 134–144)
Total Protein: 8.2 g/dL (ref 6.0–8.5)
eGFR: 109 mL/min/{1.73_m2} (ref >59)

## 2021-08-15 LAB — HEMOGLOBIN A1C
Est. average glucose Bld gHb Est-mCnc: 91 mg/dL
Hgb A1c MFr Bld: 4.8 % (ref 4.8–5.6)

## 2021-08-15 LAB — BETA HCG QUANT (REF LAB): hCG Quant: <1 m[IU]/mL

## 2021-08-15 LAB — TSH+FREE T4
Free T4: 0.98 ng/dL (ref 0.82–1.77)
TSH: 2.49 u[IU]/mL (ref 0.450–4.500)

## 2021-08-15 LAB — LIPASE: Lipase: 18 U/L (ref 14–72)

## 2021-12-01 ENCOUNTER — Ambulatory Visit (INDEPENDENT_AMBULATORY_CARE_PROVIDER_SITE_OTHER): Payer: BC Managed Care – PPO | Admitting: Physician Assistant

## 2021-12-01 ENCOUNTER — Encounter: Payer: Self-pay | Admitting: Physician Assistant

## 2021-12-01 VITALS — BP 130/70 | HR 98 | Ht 63.0 in | Wt 152.0 lb

## 2021-12-01 DIAGNOSIS — Z0000 Encounter for general adult medical examination without abnormal findings: Secondary | ICD-10-CM

## 2021-12-01 NOTE — Progress Notes (Signed)
Complete physical exam   Patient: [place]   DOB: [birth year]   46 y.o. Female  MRN: [id number] Visit Date: 12/01/2021  Chief Complaint  Patient presents with   Annual Exam    Fasting CPE,no other concerns Pt does not see a GYN for paps. Needs form for school completed   [place] is a 46 y.o. female who presents today for a complete physical exam.   Reports is generally feeling well; is eating a regular diet; is sleeping well 6 - 7 hours; drinks 1 1/2 liter bottles of water a day; is not exercising regularly; declines routine lab screening; declines pap smear; declines tetanus vaccine.   HPI HPI     Annual Exam    Additional comments: Fasting CPE,no other concerns Pt does not see a GYN for paps. Needs form for school completed      Last edited by Deforest Hoyles, [place] on 12/01/2021  1:49 PM.        Past Medical History:  Diagnosis Date   Gastric ulcer 2019   History reviewed. No pertinent surgical history. Social History   Socioeconomic History   Marital status: Married    Spouse name: Not on file   Number of children: 0   Years of education: Not on file   Highest education level: Not on file  Occupational History   Not on file  Tobacco Use   Smoking status: Never   Smokeless tobacco: Never  Vaping Use   Vaping Use: Never used  Substance and Sexual Activity   Alcohol use: Never   Drug use: Never   Sexual activity: Yes  Other Topics Concern   Not on file  Social History Narrative   Not on file   Social Determinants of Health   Financial Resource Strain: Not on file  Food Insecurity: Not on file  Transportation Needs: Not on file  Physical Activity: Not on file  Stress: Not on file  Social Connections: Not on file  Intimate Partner Violence: Not on file   Family Status  Relation Name Status   Mother  Alive   Father  Alive   Neg Hx  (Not Specified)   Family History  Problem Relation Age of Onset    Cancer - Colon Neg Hx    Esophageal cancer Neg Hx    Colon cancer Neg Hx    Colon polyps Neg Hx    Stomach cancer Neg Hx    Rectal cancer Neg Hx    No Known Allergies  Patient Care Team: Marcellina Millin as PCP - General (Physician Assistant)   Medications: No outpatient medications prior to visit.   No facility-administered medications prior to visit.    Review of Systems  Constitutional:  Negative for activity change and fever.  HENT:  Negative for congestion, ear pain and voice change.   Eyes:  Negative for redness.  Respiratory:  Negative for cough.   Cardiovascular:  Negative for chest pain.  Gastrointestinal:  Negative for constipation and diarrhea.  Endocrine: Negative for polyuria.  Genitourinary:  Negative for flank pain.  Musculoskeletal:  Negative for gait problem and neck stiffness.  Skin:  Negative for color change and rash.  Neurological:  Negative for dizziness.  Hematological:  Negative for adenopathy.  Psychiatric/Behavioral:  Negative for agitation, behavioral problems and confusion.     Last CBC Lab Results  Component Value Date   WBC 5.3 08/14/2021   HGB 11.9 08/14/2021  HCT 36.5 08/14/2021   MCV 93 08/14/2021   MCH 30.4 08/14/2021   RDW 12.5 08/14/2021   PLT 222 50/35/4656   Last metabolic panel Lab Results  Component Value Date   GLUCOSE 95 08/14/2021   NA 138 08/14/2021   K 4.0 08/14/2021   CL 102 08/14/2021   CO2 21 08/14/2021   BUN 12 08/14/2021   CREATININE 0.66 08/14/2021   EGFR 109 08/14/2021   CALCIUM 9.8 08/14/2021   PROT 8.2 08/14/2021   ALBUMIN 4.9 (H) 08/14/2021   LABGLOB 3.3 08/14/2021   AGRATIO 1.5 08/14/2021   BILITOT 0.5 08/14/2021   ALKPHOS 63 08/14/2021   AST 16 08/14/2021   ALT 8 08/14/2021   Last lipids No results found for: "CHOL", "HDL", "LDLCALC", "LDLDIRECT", "TRIG", "CHOLHDL" Last hemoglobin A1c Lab Results  Component Value Date   HGBA1C 4.8 08/14/2021   Last thyroid functions Lab Results   Component Value Date   TSH 2.490 08/14/2021   T3TOTAL 149 10/23/2020      The ASCVD Risk score (Arnett DK, et al., 2019) failed to calculate for the following reasons:   Cannot find a previous HDL lab   Cannot find a previous total cholesterol lab   Objective    BP 130/70   Pulse 98   Ht 5' 3" (1.6 m)   Wt 152 lb (68.9 kg)   LMP 11/28/2021   SpO2 100%   BMI 26.93 kg/m      Physical Exam Vitals and nursing note reviewed.  Constitutional:      General: She is not in acute distress.    Appearance: Normal appearance. She is not ill-appearing.  HENT:     Head: Normocephalic and atraumatic.     Right Ear: Tympanic membrane, ear canal and external ear normal.     Left Ear: Tympanic membrane, ear canal and external ear normal.     Nose: No congestion.  Eyes:     Extraocular Movements: Extraocular movements intact.     Conjunctiva/sclera: Conjunctivae normal.     Pupils: Pupils are equal, round, and reactive to light.  Neck:     Vascular: No carotid bruit.  Cardiovascular:     Rate and Rhythm: Normal rate and regular rhythm.     Pulses: Normal pulses.     Heart sounds: Normal heart sounds.  Pulmonary:     Effort: Pulmonary effort is normal.     Breath sounds: Normal breath sounds. No wheezing.  Abdominal:     General: Bowel sounds are normal.     Palpations: Abdomen is soft.  Musculoskeletal:        General: Normal range of motion.     Cervical back: Normal range of motion and neck supple.     Right lower leg: No edema.     Left lower leg: No edema.  Skin:    General: Skin is warm and dry.     Findings: No bruising.  Neurological:     General: No focal deficit present.     Mental Status: She is alert and oriented to person, place, and time.  Psychiatric:        Mood and Affect: Mood normal.        Behavior: Behavior normal.        Thought Content: Thought content normal.       Last depression screening scores    12/01/2021    1:48 PM 10/23/2020    3:51  PM 01/18/2020    4:01 PM  PHQ  2/9 Scores  PHQ - 2 Score 0 0 0   Last fall risk screening    12/01/2021    1:48 PM  [place] in the past year? 0  Number falls in past yr: 0  Injury with Fall? 0  Risk for fall due to : No Fall Risks  Follow up Falls evaluation completed     No results found for any visits on 12/01/21.  Assessment & Plan    Routine Health Maintenance and Physical Exam  Exercise Activities and Dietary recommendations  Goals   None      There is no immunization history on file for this patient.  Health Maintenance  Topic Date Due   HIV Screening  Never done   Hepatitis C Screening  Never done   TETANUS/TDAP  Never done   PAP SMEAR-Modifier  Never done   INFLUENZA VACCINE  01/13/2022   COLONOSCOPY (Pts 45-6yr Insurance coverage will need to be confirmed)  03/15/2031   HPV VACCINES  Aged Out    Discussed health benefits of physical activity, and encouraged her to engage in regular exercise appropriate for her age and condition.  Problem List Items Addressed This Visit   None Visit Diagnoses     Routine medical exam    -  Primary  Well developed female; physical exam form for college filled out and copy to be scanned into chart      Return in about 1 year (around 12/02/2022) for Return for Annual Exam.     LIrene Pap PA-C

## 2021-12-01 NOTE — Patient Instructions (Signed)
.Preventative Care for Adults - Female      MAINTAIN REGULAR HEALTH EXAMS: A routine yearly physical is a good way to check in with your primary care provider about your health and preventive screening. It is also an opportunity to share updates about your health and any concerns you have, and receive a thorough all-over exam.  Most health insurance companies pay for at least some preventative services.  Check with your health plan for specific coverages.  WHAT PREVENTATIVE SERVICES DO WOMEN NEED? Adult women should have their weight and blood pressure checked regularly.  Women age 35 and older should have their cholesterol levels checked regularly. Women should be screened for cervical cancer with a Pap smear and pelvic exam beginning at either age 21, or 3 years after they become sexually activity.   Breast cancer screening generally begins at age 40 with a mammogram and breast exam by your primary care provider.   Beginning at age 45 and continuing to age 75, women should be screened for colorectal cancer.  Certain people may need continued testing until age 85. Updating vaccinations is part of preventative care.  Vaccinations help protect against diseases such as the flu. Osteoporosis is a disease in which the bones lose minerals and strength as we age. Women ages 65 and over should discuss this with their caregivers, as should women after menopause who have other risk factors. Lab tests are generally done as part of preventative care to screen for anemia and blood disorders, to screen for problems with the kidneys and liver, to screen for bladder problems, to check blood sugar, and to check your cholesterol level. Preventative services generally include counseling about diet, exercise, avoiding tobacco, drugs, excessive alcohol consumption, and sexually transmitted infections.    GENERAL RECOMMENDATIONS FOR GOOD HEALTH:  Healthy diet: Eat a variety of foods, including fruit, vegetables,  animal or vegetable protein, such as meat, fish, chicken, and eggs, or beans, lentils, tofu, and grains, such as rice. Drink plenty of water daily (60 - 80 ounces or 8 - 10 glasses a day) Decrease saturated fat in the diet, avoid lots of red meat, processed foods, sweets, fast foods, and fried foods. For high cholesterol - Increase fiber intake (Benefiber or Metamucil, Cherrios,  oatmeal, beans, nuts, fruits and vegetables), limit saturated fats (in fried foods, red meat), can add OTC fish oil supplement, eat fish with Omega-3 fatty acids like salmon and tuna, exercise for 30 minutes 3 - 5 times a week, drink 8 - 10 glasses of water a day  Exercise: Aerobic exercise helps maintain good heart health. At least 30-40 minutes of moderate-intensity exercise is recommended. For example, a brisk walk that increases your heart rate and breathing. This should be done on most days of the week.  Find a type of exercise or a variety of exercises that you enjoy so that it becomes a part of your daily life.  Examples are running, walking, swimming, water aerobics, and biking.  For motivation and support, explore group exercise such as aerobic class, spin class, Zumba, Yoga,or  martial arts, etc.   Set exercise goals for yourself, such as a certain weight goal, walk or run in a race such as a 5k walk/run.  Speak to your primary care provider about exercise goals.  Disease prevention: If you smoke or chew tobacco, find out from your caregiver how to quit. It can literally save your life, no matter how long you have been a tobacco user. If you do not   use tobacco, never begin.  Maintain a healthy diet and normal weight. Increased weight leads to problems with blood pressure and diabetes.  The Body Mass Index or BMI is a way of measuring how much of your body is fat. Having a BMI above 27 increases the risk of heart disease, diabetes, hypertension, stroke and other problems related to obesity. Your caregiver can help  determine your BMI and based on it develop an exercise and dietary program to help you achieve or maintain this important measurement at a healthful level. High blood pressure causes heart and blood vessel problems.  Persistent high blood pressure should be treated with medicine if weight loss and exercise do not work.  Fat and cholesterol leaves deposits in your arteries that can block them. This causes heart disease and vessel disease elsewhere in your body.  If your cholesterol is found to be high, or if you have heart disease or certain other medical conditions, then you may need to have your cholesterol monitored frequently and be treated with medication.  Ask if you should have a cardiac stress test if your history suggests this. A stress test is a test done on a treadmill that looks for heart disease. This test can find disease prior to there being a problem. Menopause can be associated with physical symptoms and risks. Hormone replacement therapy is available to decrease these. You should talk to your caregiver about whether starting or continuing to take hormones is right for you.  Osteoporosis is a disease in which the bones lose minerals and strength as we age. This can result in serious bone fractures. Risk of osteoporosis can be identified using a bone density scan. Women ages 65 and over should discuss this with their caregivers, as should women after menopause who have other risk factors. Ask your caregiver whether you should be taking a calcium supplement and Vitamin D, to reduce the rate of osteoporosis.  Avoid drinking alcohol in excess (more than two drinks per day).  Avoid use of street drugs. Do not share needles with anyone. Ask for professional help if you need assistance or instructions on stopping the use of alcohol, cigarettes, and/or drugs. Brush your teeth twice a day with fluoride toothpaste, and floss once a day. Good oral hygiene prevents tooth decay and gum disease. The  problems can be painful, unattractive, and can cause other health problems. Visit your dentist for a routine oral and dental check up and preventive care every 6-12 months.  Look at your skin regularly.  Use a mirror to look at your back. Notify your caregivers of changes in moles, especially if there are changes in shapes, colors, a size larger than a pencil eraser, an irregular border, or development of new moles.  Safety: Use seatbelts 100% of the time, whether driving or as a passenger.  Use safety devices such as hearing protection if you work in environments with loud noise or significant background noise.  Use safety glasses when doing any work that could send debris in to the eyes.  Use a helmet if you ride a bike or motorcycle.  Use appropriate safety gear for contact sports.  Talk to your caregiver about gun safety. Use sunscreen with a SPF (or skin protection factor) of 15 or greater.  Lighter skinned people are at a greater risk of skin cancer. Don't forget to also wear sunglasses in order to protect your eyes from too much damaging sunlight. Damaging sunlight can accelerate cataract formation.  Practice safe sex. Use   condoms. Condoms are used for birth control and to help reduce the spread of sexually transmitted infections (or STIs).  Some of the STIs are gonorrhea (the clap), chlamydia, syphilis, trichomonas, herpes, HPV (human papilloma virus) and HIV (human immunodeficiency virus) which causes AIDS. The herpes, HIV and HPV are viral illnesses that have no cure. These can result in disability, cancer and death.  Keep carbon monoxide and smoke detectors in your home functioning at all times. Change the batteries every 6 months or use a model that plugs into the wall.   Vaccinations: Stay up to date with your tetanus shots and other required immunizations. You should have a booster for tetanus every 10 years. Be sure to get your flu shot every year, since 5%-20% of the [place] population comes  down with the flu. The flu vaccine changes each year, so being vaccinated once is not enough. Get your shot in the fall, before the flu season peaks.   Other vaccines to consider: Human Papilloma Virus or HPV causes cancer of the cervix, and other infections that can be transmitted from person to person. There is a vaccine for HPV, and females should get immunized between the ages of 11 and 26. It requires a series of 3 shots.  Pneumococcal vaccine to protect against certain types of pneumonia.  This is normally recommended for adults age 65 or older.  However, adults younger than 48 years old with certain underlying conditions such as diabetes, heart or lung disease should also receive the vaccine. Shingles vaccine to protect against Varicella Zoster if you are older than age 60, or younger than 48 years old with certain underlying illness. Hepatitis A vaccine to protect against a form of infection of the liver by a virus acquired from food. Hepatitis B vaccine to protect against a form of infection of the liver by a virus acquired from blood or body fluids, particularly if you work in health care. If you plan to travel internationally, check with your local health department for specific vaccination recommendations.  Cancer Screening: Breast cancer screening is essential to preventive care for women. All women age 20 and older should perform a breast self-exam every month. At age 40 and older, women should have their caregiver complete a breast exam each year. Women at ages 40 and older should have a mammogram (x-ray film) of the breasts. Your caregiver can discuss how often you need mammograms.   Cervical cancer screening includes taking a Pap smear (sample of cells examined under a microscope) from the cervix (end of the uterus). It also includes testing for HPV (Human Papilloma Virus, which can cause cervical cancer). Screening and a pelvic exam should begin at age 21, or 3 years after a woman  becomes sexually active. Screening should occur every year, with a Pap smear but no HPV testing, up to age 30. After age 30, you should have a Pap smear every 3 years with HPV testing, if no HPV was found previously.  Most routine colon cancer screening begins at the age of 50. On a yearly basis, doctors may provide special easy to use take-home tests to check for hidden blood in the stool. Sigmoidoscopy or colonoscopy can detect the earliest forms of colon cancer and is life saving. These tests use a small camera at the end of a tube to directly examine the colon. Speak to your caregiver about this at age 50, when routine screening begins (and is repeated every 5 years unless early forms of pre-cancerous polyps   or small growths are found).    

## 2021-12-12 ENCOUNTER — Encounter: Payer: Self-pay | Admitting: Internal Medicine

## 2022-01-06 ENCOUNTER — Ambulatory Visit (INDEPENDENT_AMBULATORY_CARE_PROVIDER_SITE_OTHER): Payer: BC Managed Care – PPO | Admitting: Medical

## 2022-01-06 ENCOUNTER — Encounter: Payer: Self-pay | Admitting: Medical

## 2022-01-06 VITALS — BP 130/80 | HR 107 | Wt 155.6 lb

## 2022-01-06 DIAGNOSIS — Z7185 Encounter for immunization safety counseling: Secondary | ICD-10-CM | POA: Diagnosis not present

## 2022-01-06 DIAGNOSIS — Z139 Encounter for screening, unspecified: Secondary | ICD-10-CM

## 2022-01-06 DIAGNOSIS — Z23 Encounter for immunization: Secondary | ICD-10-CM

## 2022-01-06 DIAGNOSIS — Z7189 Other specified counseling: Secondary | ICD-10-CM

## 2022-01-06 DIAGNOSIS — Z111 Encounter for screening for respiratory tuberculosis: Secondary | ICD-10-CM | POA: Diagnosis not present

## 2022-01-06 NOTE — Progress Notes (Signed)
Subjective:  Julie Shaw is a 46 y.o. 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  Has form of prerequisites for vaccines and TB test.    Originally from [place], [place].  Came to [place] 08/2016.  She does not have any vaccine documents.   She did get some shots in her home country before coming in 2018 to [place].   In good health without c/o.   No other aggravating or relieving factors.    No other c/o.  The following portions of the patient's history were reviewed and updated as appropriate: allergies, current medications, past family history, past medical history, past social history, past surgical history and problem list.  ROS Otherwise as in subjective above  Objective: BP 130/80   Pulse (!) 107   Wt 155 lb 9.6 oz (70.6 kg)   SpO2 99%   BMI 27.56 kg/m   General appearance: alert, no distress, well developed, well nourished     Assessment: Encounter Diagnoses  Name Primary?   Counseling on health promotion and disease prevention Yes   Screening for condition    Need for Tdap vaccination    Vaccine counseling    Screening for tuberculosis      Plan: Discussed her nursing school forms.  She had a well visit earlier in this year and labs which were reviewed  Labs as below today  Counseled on the Tdap (tetanus, diptheria, and acellular pertussis) vaccine.  Vaccine information sheet given. Tdap vaccine given after consent obtained.  [name] was seen today for follow-up.  Diagnoses and all orders for this visit:  Counseling on health promotion and disease prevention  Screening for condition -     Hepatitis B surface antibody,quantitative -     Measles/Mumps/Rubella Immunity -     Varicella zoster antibody, IgG -     QuantiFERON-TB Gold Plus  Need for Tdap vaccination -      Tdap vaccine greater than or equal to 7yo IM  Vaccine counseling  Screening for tuberculosis -     QuantiFERON-TB Gold Plus    Follow up: pending labs

## 2022-01-09 ENCOUNTER — Other Ambulatory Visit: Payer: BC Managed Care – PPO

## 2022-01-09 LAB — QUANTIFERON-TB GOLD PLUS
QuantiFERON Mitogen Value: >10 IU/mL
QuantiFERON Nil Value: 0.13 IU/mL
QuantiFERON TB1 Ag Value: 0.46 IU/mL
QuantiFERON TB2 Ag Value: 0.41 IU/mL
QuantiFERON-TB Gold Plus: NEGATIVE

## 2022-01-09 LAB — MEASLES/MUMPS/RUBELLA IMMUNITY
MUMPS ABS, IGG: 236 AU/mL (ref >10.9)
RUBEOLA AB, IGG: >300 AU/mL (ref >16.4)
Rubella Antibodies, IGG: 17.2 index (ref >0.99)

## 2022-01-09 LAB — VARICELLA ZOSTER ANTIBODY, IGG: Varicella zoster IgG: 1699 index (ref >165)

## 2022-01-09 LAB — HEPATITIS B SURFACE ANTIBODY, QUANTITATIVE: Hepatitis B Surf Ab Quant: >1000 m[IU]/mL (ref >9.9)

## 2022-01-12 ENCOUNTER — Other Ambulatory Visit: Payer: BC Managed Care – PPO

## 2022-01-15 ENCOUNTER — Telehealth: Payer: Self-pay

## 2022-01-15 NOTE — Telephone Encounter (Signed)
Pt. Needs a letter stating when the flu shot will be available for her to get for her school. Since the flu shots are not out yet. Her school is requiring a note from her doctor stating when it will be available for her to receive this year. Let me know once ok by you I can type it up and email it to the pt.

## 2022-02-18 ENCOUNTER — Encounter: Payer: Self-pay | Admitting: Internal Medicine

## 2022-06-15 HISTORY — PX: NO PAST SURGERIES: SHX2092

## 2022-12-02 ENCOUNTER — Other Ambulatory Visit: Payer: Self-pay

## 2022-12-02 ENCOUNTER — Telehealth: Payer: Self-pay | Admitting: Medical

## 2022-12-02 DIAGNOSIS — Z111 Encounter for screening for respiratory tuberculosis: Secondary | ICD-10-CM

## 2022-12-02 NOTE — Telephone Encounter (Signed)
Pt needs to come in for a tb blood test for school , is this ok?

## 2022-12-07 ENCOUNTER — Other Ambulatory Visit: Payer: Self-pay

## 2022-12-07 ENCOUNTER — Ambulatory Visit (INDEPENDENT_AMBULATORY_CARE_PROVIDER_SITE_OTHER): Payer: BC Managed Care – PPO | Admitting: Medical

## 2022-12-07 ENCOUNTER — Encounter: Payer: Self-pay | Admitting: Medical

## 2022-12-07 VITALS — BP 130/68 | HR 99 | Temp 98.0°F | Resp 16 | Ht 63.0 in | Wt 147.0 lb

## 2022-12-07 DIAGNOSIS — Z131 Encounter for screening for diabetes mellitus: Secondary | ICD-10-CM | POA: Diagnosis not present

## 2022-12-07 DIAGNOSIS — Z0000 Encounter for general adult medical examination without abnormal findings: Secondary | ICD-10-CM | POA: Diagnosis not present

## 2022-12-07 DIAGNOSIS — Z111 Encounter for screening for respiratory tuberculosis: Secondary | ICD-10-CM | POA: Diagnosis not present

## 2022-12-07 DIAGNOSIS — Z1329 Encounter for screening for other suspected endocrine disorder: Secondary | ICD-10-CM | POA: Diagnosis not present

## 2022-12-07 DIAGNOSIS — Z13220 Encounter for screening for lipoid disorders: Secondary | ICD-10-CM

## 2022-12-07 NOTE — Patient Instructions (Addendum)
Optometrists nearby  [facility] [address] Mervyn Skeeters [place], [place] [address] [phone]  Dr. Corky Mull [address] [place], [place] [address] [phone]  [facility] [address] Leonard Schwartz [place], [place] [address] [phone]  [facility], Dr. Shea Evans [address] Henderson Cloud [place], [place] [address] [phone]     DENTIST RECOMMENDATION Dr. Yancey Flemings, dentist [address] [place], [place] [address] [phone] [email or web address]      This visit was a preventative care visit, also known as wellness visit or routine physical.   Topics typically include healthy lifestyle, diet, exercise, preventative care, vaccinations, sick and well care, proper use of emergency dept and after hours care, as well as other concerns.     General Recommendations: Continue to return yearly for your annual wellness and preventative care visits.  This gives [place] a chance to discuss healthy lifestyle, exercise, vaccinations, review your chart record, and perform screenings where appropriate.  I recommend you see your eye doctor yearly for routine vision care.  I recommend you see your dentist yearly for routine dental care including hygiene visits twice yearly.   Vaccination recommendations were reviewed Immunization History  Administered Date(s) Administered   Influenza-Unspecified 01/17/2022   Tdap 01/06/2022     Screening for cancer: Colon cancer screening: Prior or last colon cancer screen: 2022   Breast cancer screening: You should perform a self breast exam monthly.   We reviewed recommendations for regular mammograms and breast cancer screening. Last mammogram: no prior, advised.  She will consider.  Cervical cancer screening: We reviewed recommendations for pap smear screening. Last pap - she will consider   Skin cancer screening: Check your skin regularly for new changes, growing lesions, or other lesions of  concern Come in for evaluation if you have skin lesions of concern.  Lung cancer screening: If you have a greater than 20 pack year history of tobacco use, then you may qualify for lung cancer screening with a chest CT scan.   Please call your insurance company to inquire about coverage for this test.  Pancreatic cancer: no current screening test is available routinely recommended.  (Risk factors: Smoking, overweight or obese, diabetes, chronic pancreatitis, work Nurse, mental health, Solicitor, 15 year old or greater, female greater than female, African-American, family history of pancreatic cancer, hereditary breast, ovarian, melanoma, Lynch, Peutz-jeghers).  We currently don't have screenings for other cancers besides breast, cervical, colon, and lung cancers.  If you have a strong family history of cancer or have other cancer screening concerns, please let me know.    Bone health: Get at least 150 minutes of aerobic exercise weekly Get weight bearing exercise at least once weekly Bone density test:  A bone density test is an imaging test that uses a type of X-ray to measure the amount of calcium and other minerals in your bones. The test may be used to diagnose or screen you for a condition that causes weak or thin bones (osteoporosis), predict your risk for a broken bone (fracture), or determine how well your osteoporosis treatment is working. The bone density test is recommended for females 65 and older, or females or males <65 if certain risk factors such as thyroid disease, long term use of steroids such as for asthma or rheumatological issues, vitamin D deficiency, estrogen deficiency, family history of osteoporosis, self or family history of fragility fracture in first degree relative.    Heart health: Get at least 150 minutes of aerobic exercise weekly Limit alcohol It is  important to maintain a healthy blood pressure and healthy cholesterol numbers  Heart disease  screening: Screening for heart disease includes screening for blood pressure, fasting lipids, glucose/diabetes screening, BMI height to weight ratio, reviewed of smoking status, physical activity, and diet.    Goals include blood pressure 120/80 or less, maintaining a healthy lipid/cholesterol profile, preventing diabetes or keeping diabetes numbers under good control, not smoking or using tobacco products, exercising most days per week or at least 150 minutes per week of exercise, and eating healthy variety of fruits and vegetables, healthy oils, and avoiding unhealthy food choices like fried food, fast food, high sugar and high cholesterol foods.    Other tests may possibly include EKG test, CT coronary calcium score, echocardiogram, exercise treadmill stress test.    Vascular disease screening: For high risk individuals including smokers, diabetes, patients with known heart disease or high blood pressure, kidney disease, and others, screening for vascular disease or atherosclerosis of the arteries is available.  Examples may include carotid ultrasound, abdominal aortic ultrasound, ABI blood flow screening in the legs, thoracic aorta screening.   Medical care options: I recommend you continue to seek care here first for routine care.  We try really hard to have available appointments Monday through Friday daytime hours for sick visits, acute visits, and physicals.  Urgent care should be used for after hours and weekends for significant issues that cannot wait till the next day.  The emergency department should be used for significant potentially life-threatening emergencies.  The emergency department is expensive, can often have long wait times for less significant concerns, so try to utilize primary care, urgent care, or telemedicine when possible to avoid unnecessary trips to the emergency department.  Virtual visits and telemedicine have been introduced since the pandemic started in 2020, and can  be convenient ways to receive medical care.  We offer virtual appointments as well to assist you in a variety of options to seek medical care.   Legal Take the time to do a Last Will and Testament, advanced directives including Healthcare Power of Attorney and Living Will documents.  Do not leave your famil

## 2022-12-07 NOTE — Progress Notes (Signed)
Subjective:   HPI  Julie Shaw is a 47 y.o. female who presents for Chief Complaint  Patient presents with   Annual Exam    Fasting. Requesting a TB test for school.     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    Reviewed their medical, surgical, family, social, medication, and allergy history and updated chart as appropriate.  No Known Allergies  Past Medical History:  Diagnosis Date   Gastric ulcer 2019    No current outpatient medications on file prior to visit.   No current facility-administered medications on file prior to visit.     No current outpatient medications on file.  Family History  Problem Relation Age of Onset   Cancer - Colon Neg Hx    Esophageal cancer Neg Hx    Colon cancer Neg Hx    Colon polyps Neg Hx    Stomach cancer Neg Hx    Rectal cancer Neg Hx    Cancer Neg Hx    Heart disease Neg Hx    Hypertension Neg Hx    Hyperlipidemia Neg Hx    Diabetes Neg Hx     Past Surgical History:  Procedure Laterality Date   COLONOSCOPY  02/2021   Dr. Tiajuana Amass   NO PAST SURGERIES  2024    Review of Systems  Constitutional:  Negative for chills, fever, malaise/fatigue and weight loss.  HENT:  Negative for congestion, ear pain, hearing loss, sore throat and tinnitus.   Eyes:  Negative for blurred vision, pain and redness.  Respiratory:  Negative for cough, hemoptysis and shortness of breath.   Cardiovascular:  Negative for chest pain, palpitations, orthopnea, claudication and leg swelling.  Gastrointestinal:  Negative for abdominal pain, blood in stool, constipation, diarrhea, nausea and vomiting.  Genitourinary:  Negative for dysuria, flank pain, frequency, hematuria and urgency.  Musculoskeletal:  Negative for falls, joint pain and myalgias.  Skin:  Negative for itching and rash.  Neurological:  Negative for  dizziness, tingling, speech change, weakness and headaches.  Endo/Heme/Allergies:  Negative for polydipsia. Does not bruise/bleed easily.  Psychiatric/Behavioral:  Negative for depression and memory loss. The patient is not nervous/anxious and does not have insomnia.          Objective:  BP 130/68   Pulse 99   Temp 98 F (36.7 C) (Oral)   Resp 16   Ht 5\' 3"  (1.6 m)   Wt 147 lb (66.7 kg)   LMP 10/31/2022   SpO2 99% Comment: room air  BMI 26.04 kg/m   General appearance: alert, no distress, WD/WN, African American female Skin: unremarkable HEENT: normocephalic, conjunctiva/corneas normal, sclerae anicteric, PERRLA, EOMi, nares patent, no discharge or erythema, pharynx normal Oral cavity: MMM, tongue normal, teeth normal Neck: supple, no lymphadenopathy, no thyromegaly, no masses, normal ROM, no bruits Chest: non tender, normal shape and expansion Heart: RRR, normal S1, S2, no murmurs Lungs: CTA bilaterally, no wheezes, rhonchi, or rales Abdomen: +bs, soft, non tender, non distended, no masses, no hepatomegaly, no splenomegaly, no bruits Back: non tender, normal ROM, no scoliosis Musculoskeletal: upper extremities non tender, no obvious deformity, normal ROM throughout, lower extremities non tender, no obvious deformity, normal ROM throughout Extremities: no edema, no cyanosis, no clubbing Pulses: 2+ symmetric, upper and lower extremities, normal cap refill Neurological: alert, oriented x 3, CN2-12 intact, strength normal upper extremities and lower extremities, sensation  normal throughout, DTRs 2+ throughout, no cerebellar signs, gait normal Psychiatric: normal affect, behavior normal, pleasant  Breast/gyn/rectal - deferred to gynecology    Assessment and Plan :   Encounter Diagnoses  Name Primary?   Encounter for health maintenance examination in adult Yes   Screening for tuberculosis    Screening for lipid disorders    Screening for diabetes mellitus    Screening for  thyroid disorder      This visit was a preventative care visit, also known as wellness visit or routine physical.   Topics typically include healthy lifestyle, diet, exercise, preventative care, vaccinations, sick and well care, proper use of emergency dept and after hours care, as well as other concerns.     General Recommendations: Continue to return yearly for your annual wellness and preventative care visits.  This gives [place] a chance to discuss healthy lifestyle, exercise, vaccinations, review your chart record, and perform screenings where appropriate.  I recommend you see your eye doctor yearly for routine vision care.  I recommend you see your dentist yearly for routine dental care including hygiene visits twice yearly.   Vaccination recommendations were reviewed Immunization History  Administered Date(s) Administered   Influenza-Unspecified 01/17/2022   Tdap 01/06/2022     Screening for cancer: Colon cancer screening: Prior or last colon cancer screen: 2022   Breast cancer screening: You should perform a self breast exam monthly.   We reviewed recommendations for regular mammograms and breast cancer screening. Last mammogram: no prior, advised.  She will consider.  Cervical cancer screening: We reviewed recommendations for pap smear screening. Last pap - she will consider   Skin cancer screening: Check your skin regularly for new changes, growing lesions, or other lesions of concern Come in for evaluation if you have skin lesions of concern.  Lung cancer screening: If you have a greater than 20 pack year history of tobacco use, then you may qualify for lung cancer screening with a chest CT scan.   Please call your insurance company to inquire about coverage for this test.  Pancreatic cancer: no current screening test is available routinely recommended.  (Risk factors: Smoking, overweight or obese, diabetes, chronic pancreatitis, work Nurse, mental health,  Solicitor, 72 year old or greater, female greater than female, African-American, family history of pancreatic cancer, hereditary breast, ovarian, melanoma, Lynch, Peutz-jeghers).  We currently don't have screenings for other cancers besides breast, cervical, colon, and lung cancers.  If you have a strong family history of cancer or have other cancer screening concerns, please let me know.    Bone health: Get at least 150 minutes of aerobic exercise weekly Get weight bearing exercise at least once weekly Bone density test:  A bone density test is an imaging test that uses a type of X-ray to measure the amount of calcium and other minerals in your bones. The test may be used to diagnose or screen you for a condition that causes weak or thin bones (osteoporosis), predict your risk for a broken bone (fracture), or determine how well your osteoporosis treatment is working. The bone density test is recommended for females 65 and older, or females or males <65 if certain risk factors such as thyroid disease, long term use of steroids such as for asthma or rheumatological issues, vitamin D deficiency, estrogen deficiency, family history of osteoporosis, self or family history of fragility fracture in first degree relative.    Heart health: Get at least 150 minutes of aerobic exercise weekly Limit alcohol It is important to maintain  a healthy blood pressure and healthy cholesterol numbers  Heart disease screening: Screening for heart disease includes screening for blood pressure, fasting lipids, glucose/diabetes screening, BMI height to weight ratio, reviewed of smoking status, physical activity, and diet.    Goals include blood pressure 120/80 or less, maintaining a healthy lipid/cholesterol profile, preventing diabetes or keeping diabetes numbers under good control, not smoking or using tobacco products, exercising most days per week or at least 150 minutes per week of exercise, and eating healthy  variety of fruits and vegetables, healthy oils, and avoiding unhealthy food choices like fried food, fast food, high sugar and high cholesterol foods.    Other tests may possibly include EKG test, CT coronary calcium score, echocardiogram, exercise treadmill stress test.    Vascular disease screening: For high risk individuals including smokers, diabetes, patients with known heart disease or high blood pressure, kidney disease, and others, screening for vascular disease or atherosclerosis of the arteries is available.  Examples may include carotid ultrasound, abdominal aortic ultrasound, ABI blood flow screening in the legs, thoracic aorta screening.   Medical care options: I recommend you continue to seek care here first for routine care.  We try really hard to have available appointments Monday through Friday daytime hours for sick visits, acute visits, and physicals.  Urgent care should be used for after hours and weekends for significant issues that cannot wait till the next day.  The emergency department should be used for significant potentially life-threatening emergencies.  The emergency department is expensive, can often have long wait times for less significant concerns, so try to utilize primary care, urgent care, or telemedicine when possible to avoid unnecessary trips to the emergency department.  Virtual visits and telemedicine have been introduced since the pandemic started in 2020, and can be convenient ways to receive medical care.  We offer virtual appointments as well to assist you in a variety of options to seek medical care.   Legal Take the time to do a Last Will and Testament, advanced directives including Healthcare Power of Attorney and Living Will documents.  Do not leave your family with burdens that can be handled ahead of time.   Advanced Directives: I recommend you consider completing a Health Care Power of Attorney and Living Will.   These documents respect your  wishes and help alleviate burdens on your loved ones if you were to become terminally ill or be in a position to need those documents enforced.    You can complete Advanced Directives yourself, have them notarized, then have copies made for our office, for you and for anybody you feel should have them in safe keeping.  Or, you can have an attorney prepare these documents.   If you haven't updated your Last Will and Testament in a while, it may be worthwhile having an attorney prepare these documents together and save on some costs.       Spiritual and Emotional Health Keeping a healthy spiritual life can help you better manage your physical health. Your spiritual life can help you to cope with any issues that may arise with your physical health.  Balance can keep [place] healthy and help [place] to recover.  If you are struggling with your spiritual health there are questions that you may want to ask yourself:  What makes me feel most complete? When do I feel most connected to the rest of the world? Where do I find the most inner strength? What am I doing when I feel  whole?  Helpful tips: Being in nature. Some people feel very connected and at peace when they are walking outdoors or are outside. Helping others. Some feel the largest sense of wellbeing when they are of service to others. Being of service can take on many forms. It can be doing volunteer work, being kind to strangers, or offering a hand to a friend in need. Gratitude. Some people find they feel the most connected when they remain grateful. They may make lists of all the things they are grateful for or say a thank you out loud for all they have.    Emotional Health Are you in tune with your emotional health?  Check out this link: http://www.marquez-love.com/   Financial Health Make sure you use a budget for your personal finances Make sure you are insured against risks (health insurance, life insurance, auto insurance,  etc) Save more, spend less Set financial goals If you need help in this area, good resources include counseling through [employer] or other community resources, have a meeting with a Social research officer, government, and a good resource is Salley Slaughter podcast    Julie was seen today for annual exam.  Diagnoses and all orders for this visit:  Encounter for health maintenance examination in adult -     Comprehensive metabolic panel -     CBC -     Lipid panel -     Hemoglobin A1c -     TSH -     QuantiFERON-TB Gold Plus  Screening for tuberculosis -     QuantiFERON-TB Gold Plus  Screening for lipid disorders -     Lipid panel  Screening for diabetes mellitus -     Hemoglobin A1c  Screening for thyroid disorder -     TSH    Follow-up pending labs, yearly for physical

## 2022-12-07 NOTE — Addendum Note (Signed)
Addended by: Jac Canavan on: 12/07/2022 09:25 AM   Modules accepted: Orders

## 2022-12-08 LAB — LIPID PANEL
Chol/HDL Ratio: 3.8 ratio (ref 0.0–4.4)
Cholesterol, Total: 234 mg/dL — ABNORMAL HIGH (ref 100–199)
HDL: 62 mg/dL (ref >39)
LDL Chol Calc (NIH): 159 mg/dL — ABNORMAL HIGH (ref 0–99)
Triglycerides: 77 mg/dL (ref 0–149)
VLDL Cholesterol Cal: 13 mg/dL (ref 5–40)

## 2022-12-08 LAB — COMPREHENSIVE METABOLIC PANEL
ALT: 13 IU/L (ref 0–32)
AST: 18 IU/L (ref 0–40)
Albumin: 4.5 g/dL (ref 3.9–4.9)
Alkaline Phosphatase: 65 IU/L (ref 44–121)
BUN/Creatinine Ratio: 19 (ref 9–23)
BUN: 16 mg/dL (ref 6–24)
Bilirubin Total: 0.3 mg/dL (ref 0.0–1.2)
CO2: 22 mmol/L (ref 20–29)
Calcium: 10.1 mg/dL (ref 8.7–10.2)
Chloride: 106 mmol/L (ref 96–106)
Creatinine, Ser: 0.84 mg/dL (ref 0.57–1.00)
Globulin, Total: 3.3 g/dL (ref 1.5–4.5)
Glucose: 95 mg/dL (ref 70–99)
Potassium: 4.5 mmol/L (ref 3.5–5.2)
Sodium: 142 mmol/L (ref 134–144)
Total Protein: 7.8 g/dL (ref 6.0–8.5)
eGFR: 86 mL/min/{1.73_m2} (ref >59)

## 2022-12-08 LAB — CBC
Hematocrit: 36.6 % (ref 34.0–46.6)
Hemoglobin: 11.4 g/dL (ref 11.1–15.9)
MCH: 29.8 pg (ref 26.6–33.0)
MCHC: 31.1 g/dL — ABNORMAL LOW (ref 31.5–35.7)
MCV: 96 fL (ref 79–97)
Platelets: 221 10*3/uL (ref 150–450)
RBC: 3.82 x10E6/uL (ref 3.77–5.28)
RDW: 12.2 % (ref 11.7–15.4)
WBC: 4.1 10*3/uL (ref 3.4–10.8)

## 2022-12-08 LAB — QUANTIFERON-TB GOLD PLUS

## 2022-12-08 LAB — HEMOGLOBIN A1C
Est. average glucose Bld gHb Est-mCnc: 97 mg/dL
Hgb A1c MFr Bld: 5 % (ref 4.8–5.6)

## 2022-12-08 LAB — TSH: TSH: 3.34 u[IU]/mL (ref 0.450–4.500)

## 2022-12-08 NOTE — Progress Notes (Signed)
Your labs show normal blood counts, not anemic, liver kidney electrolytes and blood sugar are normal, diabetes marker and normal, thyroid normal, however cholesterol was too high.  The tuberculosis test takes several days to return.  Regarding cholesterol - Recommendations for improving lipids:  Foods TO AVOID or limit - fried foods, high sugar foods, white bread, enriched flour, fast food, red meat, large amounts of cheese, processed foods such as little debbie cakes, cookies, pies, donuts, for example  Foods TO INCLUDE in the diet - whole grains such as whole grain pasta, whole grain bread, barley, steel cut oatmeal (not instant oatmeal), avocado, fish, green leafy vegetables, nuts, increased fiber in diet, and using olive oil in small amounts for cooking or as salad dressing vinaigrette.

## 2022-12-11 ENCOUNTER — Encounter: Payer: Self-pay | Admitting: Internal Medicine

## 2022-12-12 LAB — QUANTIFERON-TB GOLD PLUS
QuantiFERON Mitogen Value: >10 IU/mL
QuantiFERON Nil Value: 0.04 IU/mL
QuantiFERON TB1 Ag Value: 0.18 IU/mL
QuantiFERON TB2 Ag Value: 0.15 IU/mL

## 2023-02-05 ENCOUNTER — Ambulatory Visit: Payer: BC Managed Care – PPO | Admitting: Medical

## 2023-02-05 VITALS — BP 122/80 | HR 94 | Wt 145.4 lb

## 2023-02-05 DIAGNOSIS — N926 Irregular menstruation, unspecified: Secondary | ICD-10-CM

## 2023-02-05 LAB — POCT URINE PREGNANCY: Preg Test, Ur: NEGATIVE

## 2023-02-05 LAB — POCT URINALYSIS DIP (PROADVANTAGE DEVICE)
Bilirubin, UA: NEGATIVE
Glucose, UA: NEGATIVE mg/dL
Ketones, POC UA: NEGATIVE mg/dL
Leukocytes, UA: NEGATIVE
Nitrite, UA: NEGATIVE
Protein Ur, POC: NEGATIVE mg/dL
Specific Gravity, Urine: 1.01
Urobilinogen, Ur: NEGATIVE
pH, UA: 6 (ref 5.0–8.0)

## 2023-02-05 NOTE — Patient Instructions (Signed)
Perimenopause Perimenopause is the normal time of a woman's life when the levels of estrogen, the female hormone produced by the ovaries, begin to decrease. This leads to changes in menstrual periods before they stop completely (menopause). Perimenopause can begin 2-8 years before menopause. During perimenopause, the ovaries may or may not produce an egg and a woman can still become pregnant. What are the causes? This condition is caused by a natural change in hormone levels that happens as you get older. What increases the risk? This condition is more likely to start at an earlier age if you have certain medical conditions or have undergone treatments, including: A tumor of the pituitary gland in the brain. A disease that affects the ovaries and hormone production. Certain cancer treatments, such as chemotherapy or hormone therapy, or radiation therapy on the pelvis. Heavy smoking and excessive alcohol use. Family history of early menopause. What are the signs or symptoms? Perimenopausal changes affect each woman differently. Symptoms of this condition may include: Hot flashes. Irregular menstrual periods. Night sweats. Changes in feelings about sex. This could be a decrease in sex drive or an increased discomfort around your sexuality. Vaginal dryness. Headaches. Mood swings. Depression. Problems sleeping (insomnia). Memory problems or trouble concentrating. Irritability. Tiredness. Weight gain. Anxiety. Trouble getting pregnant. How is this diagnosed? This condition is diagnosed based on your medical history, a physical exam, your age, your menstrual history, and your symptoms. Hormone tests may also be done. How is this treated? In some cases, no treatment is needed. You and your health care provider should make a decision together about whether treatment is necessary. Treatment will be based on your individual condition and preferences. Various treatments are available, such  as: Menopausal hormone therapy (MHT). Medicines to treat specific symptoms. Acupuncture. Vitamin or herbal supplements. Before starting treatment, make sure to let your health care provider know if you have a personal or family history of: Heart disease. Breast cancer. Blood clots. Diabetes. Osteoporosis. Follow these instructions at home: Medicines Take over-the-counter and prescription medicines only as told by your health care provider. Take vitamin supplements only as told by your health care provider. Talk with your health care provider before starting any herbal supplements. Lifestyle  Do not use any products that contain nicotine or tobacco, such as cigarettes, e-cigarettes, and chewing tobacco. If you need help quitting, ask your health care provider. Get at least 30 minutes of physical activity on 5 or more days each week. Eat a balanced diet that includes fresh fruits and vegetables, whole grains, soybeans, eggs, lean meat, and low-fat dairy. Avoid alcoholic and caffeinated beverages, as well as spicy foods. This may help prevent hot flashes. Get 7-8 hours of sleep each night. Dress in layers that can be removed to help you manage hot flashes. Find ways to manage stress, such as deep breathing, meditation, or journaling. General instructions  Keep track of your menstrual periods, including: When they occur. How heavy they are and how long they last. How much time passes between periods. Keep track of your symptoms, noting when they start, how often you have them, and how long they last. Use vaginal lubricants or moisturizers to help with vaginal dryness and improve comfort during sex. You can still become pregnant if you are having irregular periods. Make sure you use contraception during perimenopause if you do not want to get pregnant. Keep all follow-up visits. This is important. This includes any group therapy or counseling. Contact a health care provider if: You  have  heavy vaginal bleeding or pass blood clots. Your period lasts more than 2 days longer than normal. Your periods are recurring sooner than 21 days. You bleed after having sex. You have pain during sex. Get help right away if you have: Chest pain, trouble breathing, or trouble talking. Severe depression. Pain when you urinate. Severe headaches. Vision problems. Summary Perimenopause is the time when a woman's body begins to move into menopause. This may happen naturally or as a result of other health problems or medical treatments. Perimenopause can begin 2-8 years before menopause, and it can last for several years. Perimenopausal symptoms can be managed through medicines, lifestyle changes, and complementary therapies such as acupuncture. This information is not intended to replace advice given to you by your health care provider. Make sure you discuss any questions you have with your health care provider. Document Revised: 11/16/2019 Document Reviewed: 11/16/2019 Elsevier Patient Education  2024 [employer].

## 2023-02-05 NOTE — Progress Notes (Signed)
Subjective:  Julie Shaw is a 47 y.o. female who presents for Chief Complaint  Patient presents with   Medical Management of Chronic Issues    Menstrual cycle has stopped since the first of may.      Here for concerns of missed menstrual cycle.  She has not had a period for about 3 months.  Last menstrual period the first week of May 2024  She has no other symptoms.  No pain, no urinary change, no pelvic pain, no fever, no weight change.  No recent change in diet or activity or exercise.  Not under a lot of stress.  She is married.  No concern for sexually transmitted infection.  No other aggravating or relieving factors.    No other c/o.  The following portions of the patient's history were reviewed and updated as appropriate: allergies, current medications, past family history, past medical history, past social history, past surgical history and problem list.  ROS Otherwise as in subjective above    Objective: BP 122/80   Pulse 94   Wt 145 lb 6.4 oz (66 kg)   BMI 25.76 kg/m   General appearance: alert, no distress, well developed, well nourished Neck: supple, no lymphadenopathy, no thyromegaly, no masses Heart: RRR, normal S1, S2, no murmurs Lungs: CTA bilaterally, no wheezes, rhonchi, or rales Abdomen: +bs, soft, non tender, non distended, no masses, no hepatomegaly, no splenomegaly Pulses: 2+ radial pulses, 2+ pedal pulses, normal cap refill Ext: no edema    Assessment: Encounter Diagnosis  Name Primary?   Missed period Yes     Plan: We discussed the concern and possible differential.  We discussed that she may be entering perimenopausal state.  I reviewed back over her labs she just had done recently in June 2024 for well visit.  In June her thyroid was normal, not diabetic, not anemic.  We discussed that average age for menopause is closer to 74 but she could be entering menopause a little early.  Labs as below today.   [name] was seen  today for medical management of chronic issues.  Diagnoses and all orders for this visit:  Missed period -     FSH/LH -     POCT urine pregnancy -     POCT Urinalysis DIP (Proadvantage Device) -     Estrogens, Total -     Beta hCG quant (ref lab)   Follow up: pending labs

## 2023-02-07 NOTE — Progress Notes (Signed)
Results sent through MyChart

## 2023-02-12 LAB — FSH/LH
FSH: 5.6 m[IU]/mL
LH: 12.8 m[IU]/mL

## 2023-02-12 LAB — ESTROGENS, TOTAL: Estrogen: 537 pg/mL

## 2023-02-12 LAB — BETA HCG QUANT (REF LAB): hCG Quant: <1 m[IU]/mL

## 2023-02-16 NOTE — Progress Notes (Signed)
Results sent through MyChart

## 2023-03-12 IMAGING — DX DG ABDOMEN 2V
2 series · 2 of 2 positions shown · non-contrast
Comparison: None.

CLINICAL DATA: Intermittent abdominal distension and bloating x2
years.

EXAM:
ABDOMEN - 2 VIEW

[abdomen erect]
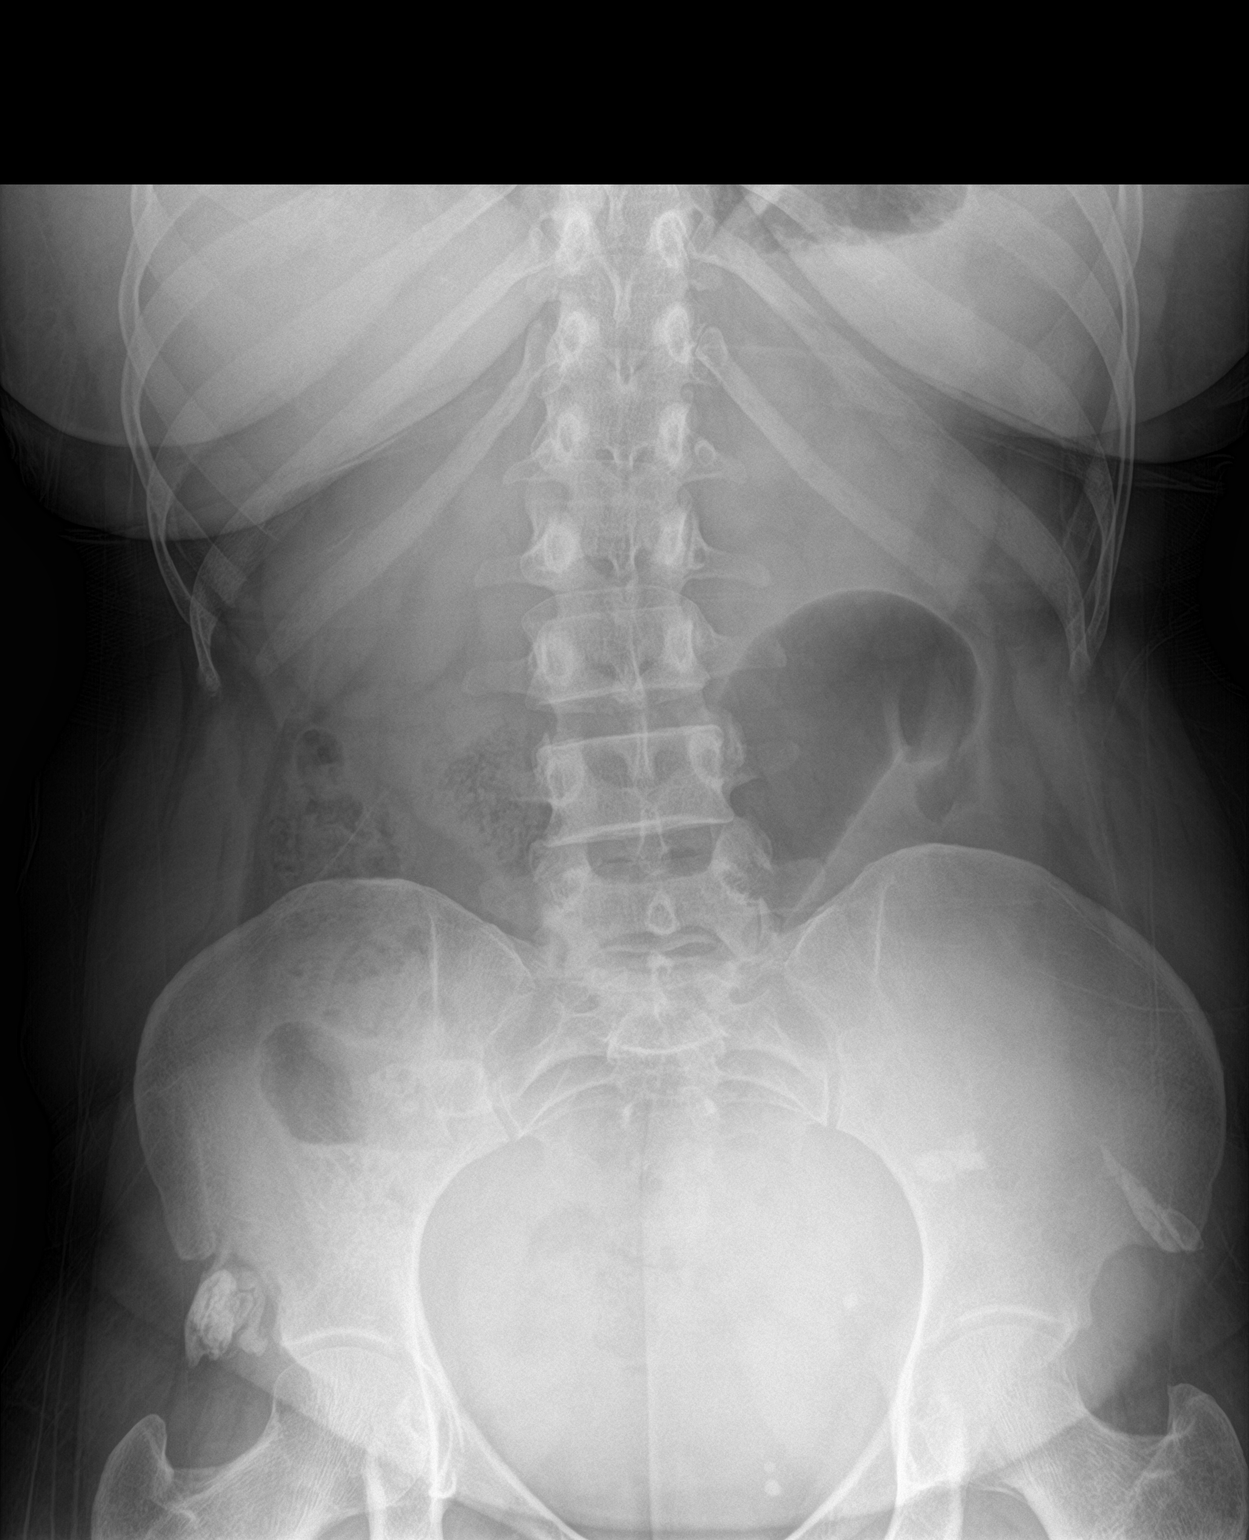

[abdomen supine]
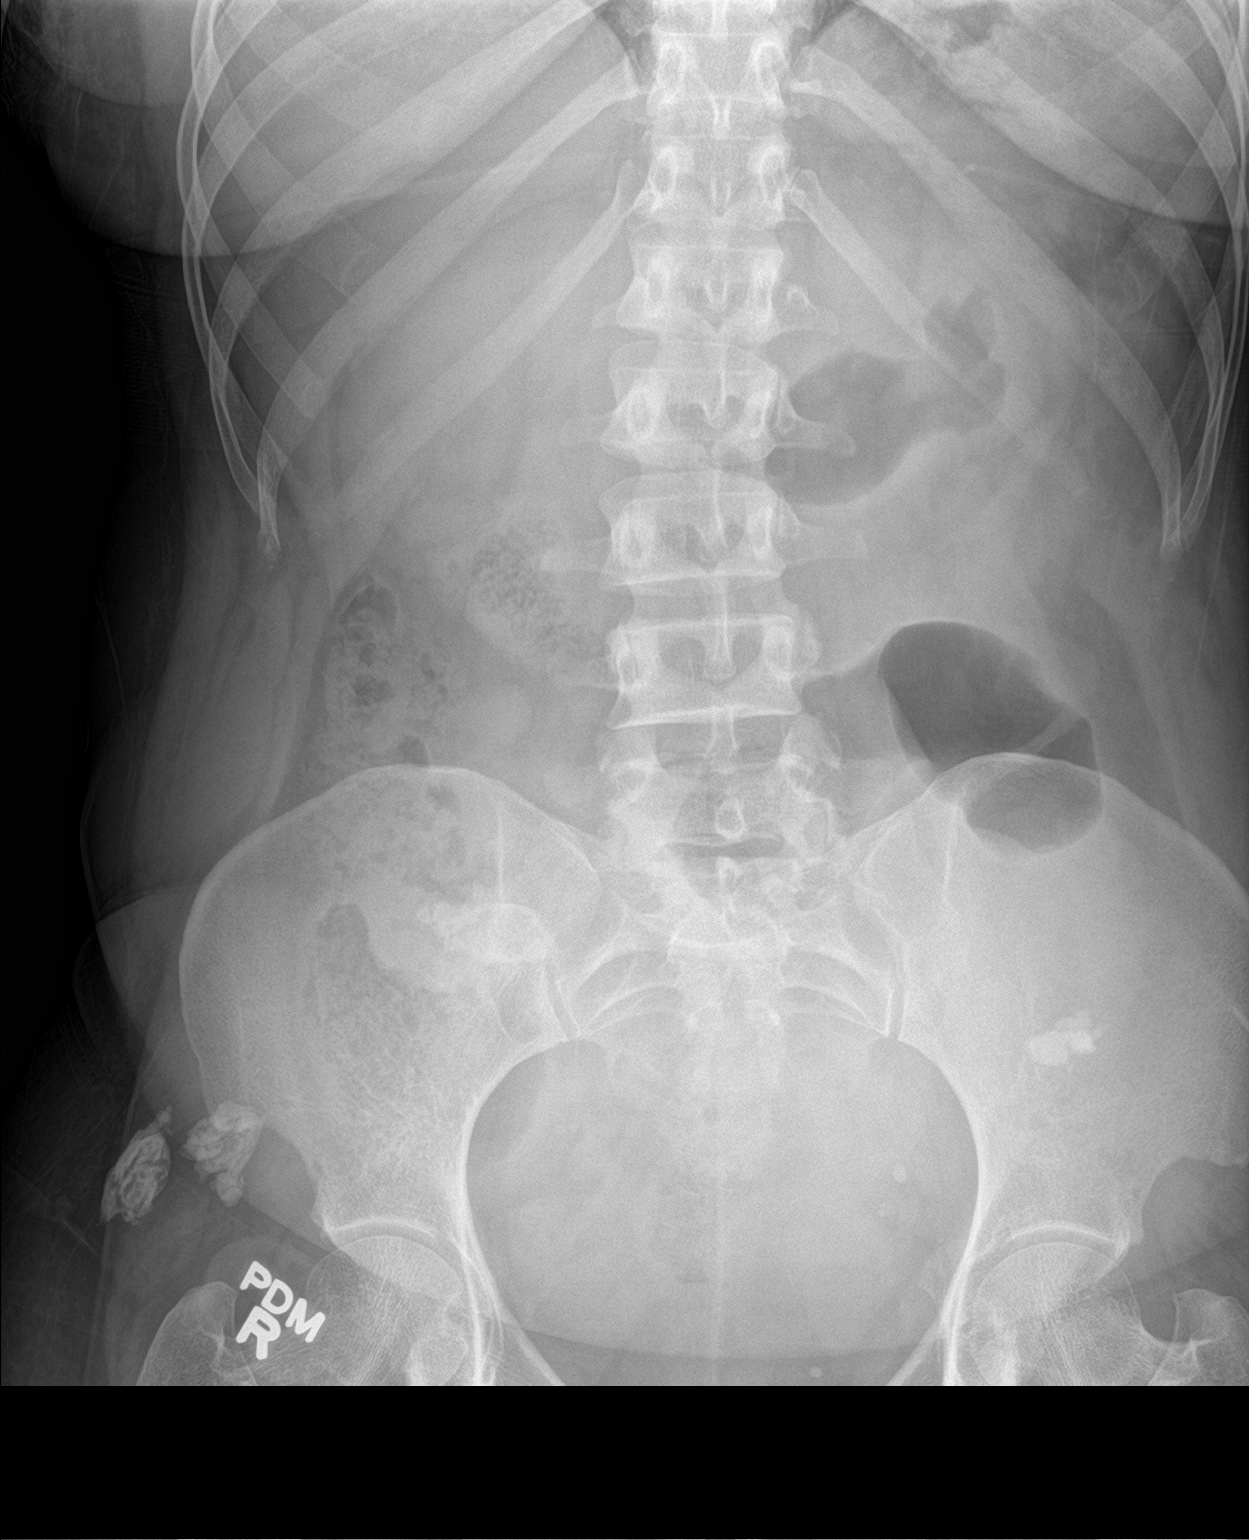

[2 of 2 positions shown; findings below may reference images not displayed]

FINDINGS: The bowel gas pattern is normal. There is no evidence of free air.
No radio-opaque calculi. Pelvic phleboliths. Lobular
hyperattenuating areas project over the pelvis, appearing to be
located within the subcutaneous soft tissues which may represent
sequela prior trauma.
IMPRESSION: Nonobstructive bowel gas pattern. No evidence of free air.

## 2023-11-26 ENCOUNTER — Ambulatory Visit (HOSPITAL_COMMUNITY)
